# Patient Record
Sex: Female | Born: 1955
Health system: Southern US, Community
[De-identification: ages and names within clinical notes are randomized; demographics above are authoritative.]

---

## 2003-03-18 ENCOUNTER — Emergency Department (HOSPITAL_COMMUNITY): Admission: EM | Admit: 2003-03-18 | Discharge: 2003-03-18 | Payer: Self-pay | Admitting: Emergency Medicine

## 2003-08-15 ENCOUNTER — Ambulatory Visit (HOSPITAL_COMMUNITY): Admission: RE | Admit: 2003-08-15 | Discharge: 2003-08-15 | Payer: Self-pay | Admitting: Family Medicine

## 2007-04-07 ENCOUNTER — Other Ambulatory Visit: Admission: RE | Admit: 2007-04-07 | Discharge: 2007-04-07 | Payer: Self-pay | Admitting: Family Medicine

## 2015-12-09 ENCOUNTER — Encounter (HOSPITAL_COMMUNITY): Payer: Self-pay | Admitting: Nurse Practitioner

## 2015-12-09 ENCOUNTER — Observation Stay (HOSPITAL_COMMUNITY)
Admission: EM | Admit: 2015-12-09 | Discharge: 2015-12-11 | Disposition: A | Discharge status: Home / Self Care | Payer: BLUE CROSS/BLUE SHIELD | Attending: General Surgery | Admitting: General Surgery

## 2015-12-09 ENCOUNTER — Emergency Department (HOSPITAL_COMMUNITY): Payer: BLUE CROSS/BLUE SHIELD

## 2015-12-09 DIAGNOSIS — R109 Unspecified abdominal pain: Secondary | ICD-10-CM | POA: Diagnosis present

## 2015-12-09 DIAGNOSIS — K388 Other specified diseases of appendix: Secondary | ICD-10-CM | POA: Diagnosis not present

## 2015-12-09 DIAGNOSIS — K353 Acute appendicitis with localized peritonitis: Principal | ICD-10-CM | POA: Insufficient documentation

## 2015-12-09 DIAGNOSIS — K3580 Unspecified acute appendicitis: Secondary | ICD-10-CM | POA: Diagnosis present

## 2015-12-09 LAB — COMPREHENSIVE METABOLIC PANEL
ALT: 24 U/L (ref 14–54)
AST: 27 U/L (ref 15–41)
Albumin: 4.1 g/dL (ref 3.5–5.0)
Alkaline Phosphatase: 78 U/L (ref 38–126)
Anion gap: 10 (ref 5–15)
BUN: 11 mg/dL (ref 6–20)
CO2: 22 mmol/L (ref 22–32)
Calcium: 9.2 mg/dL (ref 8.9–10.3)
Chloride: 107 mmol/L (ref 101–111)
Creatinine, Ser: 0.79 mg/dL (ref 0.44–1.00)
GFR calc Af Amer: >60 mL/min (ref >60)
GFR calc non Af Amer: >60 mL/min (ref >60)
Glucose, Bld: 103 mg/dL — ABNORMAL HIGH (ref 65–99)
Potassium: 4.1 mmol/L (ref 3.5–5.1)
Sodium: 139 mmol/L (ref 135–145)
Total Bilirubin: 0.7 mg/dL (ref 0.3–1.2)
Total Protein: 7 g/dL (ref 6.5–8.1)

## 2015-12-09 LAB — I-STAT CG4 LACTIC ACID, ED: Lactic Acid, Venous: 0.96 mmol/L (ref 0.5–2.0)

## 2015-12-09 LAB — URINALYSIS, ROUTINE W REFLEX MICROSCOPIC
Bilirubin Urine: NEGATIVE
Glucose, UA: NEGATIVE mg/dL
Ketones, ur: NEGATIVE mg/dL
Leukocytes, UA: NEGATIVE
Nitrite: NEGATIVE
Protein, ur: NEGATIVE mg/dL
Specific Gravity, Urine: 1.012 (ref 1.005–1.030)
pH: 6 (ref 5.0–8.0)

## 2015-12-09 LAB — I-STAT CHEM 8, ED
BUN: 16 mg/dL (ref 6–20)
Calcium, Ion: 1.06 mmol/L — ABNORMAL LOW (ref 1.13–1.30)
Chloride: 105 mmol/L (ref 101–111)
Creatinine, Ser: 0.7 mg/dL (ref 0.44–1.00)
Glucose, Bld: 99 mg/dL (ref 65–99)
HCT: 44 % (ref 36.0–46.0)
Hemoglobin: 15 g/dL (ref 12.0–15.0)
Potassium: 3.9 mmol/L (ref 3.5–5.1)
Sodium: 139 mmol/L (ref 135–145)
TCO2: 23 mmol/L (ref 0–100)

## 2015-12-09 LAB — URINE MICROSCOPIC-ADD ON: Bacteria, UA: NONE SEEN

## 2015-12-09 LAB — LIPASE, BLOOD: Lipase: 30 U/L (ref 11–51)

## 2015-12-09 LAB — CBC
HCT: 41.9 % (ref 36.0–46.0)
Hemoglobin: 14.4 g/dL (ref 12.0–15.0)
MCH: 30.1 pg (ref 26.0–34.0)
MCHC: 34.4 g/dL (ref 30.0–36.0)
MCV: 87.7 fL (ref 78.0–100.0)
Platelets: 305 10*3/uL (ref 150–400)
RBC: 4.78 MIL/uL (ref 3.87–5.11)
RDW: 13.8 % (ref 11.5–15.5)
WBC: 9.3 10*3/uL (ref 4.0–10.5)

## 2015-12-09 MED ORDER — IOPAMIDOL (ISOVUE-300) INJECTION 61%
INTRAVENOUS | Status: AC
  Administered 2015-12-09: 100 mL
  Filled 2015-12-09: qty 100

## 2015-12-09 MED ORDER — IOHEXOL 300 MG/ML  SOLN
25.0000 mL | Freq: Once | INTRAMUSCULAR | Status: AC | PRN
  Administered 2015-12-09: 25 mL via ORAL

## 2015-12-09 MED ORDER — ONDANSETRON HCL 4 MG/2ML IJ SOLN
4.0000 mg | Freq: Once | INTRAMUSCULAR | Status: AC
  Administered 2015-12-10: 4 mg via INTRAVENOUS
  Filled 2015-12-09 (×2): qty 2

## 2015-12-09 MED ORDER — METRONIDAZOLE IN NACL 5-0.79 MG/ML-% IV SOLN
500.0000 mg | Freq: Once | INTRAVENOUS | Status: AC
  Administered 2015-12-09: 500 mg via INTRAVENOUS
  Filled 2015-12-09: qty 100

## 2015-12-09 MED ORDER — MORPHINE SULFATE (PF) 4 MG/ML IV SOLN
4.0000 mg | Freq: Once | INTRAVENOUS | Status: AC
  Administered 2015-12-10: 4 mg via INTRAVENOUS
  Filled 2015-12-09 (×2): qty 1

## 2015-12-09 MED ORDER — DEXTROSE 5 % IV SOLN
2.0000 g | Freq: Once | INTRAVENOUS | Status: AC
  Administered 2015-12-09: 2 g via INTRAVENOUS
  Filled 2015-12-09: qty 2

## 2015-12-09 MED ORDER — SODIUM CHLORIDE 0.9 % IV BOLUS (SEPSIS)
1000.0000 mL | Freq: Once | INTRAVENOUS | Status: AC
  Administered 2015-12-09: 1000 mL via INTRAVENOUS

## 2015-12-09 NOTE — ED Notes (Addendum)
She c/o sudden onset RLQ pain this am. Pain increased with palpation. Pain increasingly worse since onset. Pt reports anorexia. Pt denies n,v/bowel, bladder changes. She had a temperature of 99.2 this am

## 2015-12-09 NOTE — ED Provider Notes (Signed)
CSN: [id number]     Arrival date & time 12/09/15  1842 History   First MD Initiated Contact with Patient 12/09/15 1852     Chief Complaint  Patient presents with  . Abdominal Pain     (Consider location/radiation/quality/duration/timing/severity/associated sxs/prior Treatment) Patient is a 60 y.o. female presenting with abdominal pain. The history is provided by the patient.  Abdominal Pain Pain location:  RLQ Pain quality: cramping, sharp and shooting   Pain radiates to:  Does not radiate Pain severity:  Severe Onset quality:  Gradual Duration:  1 day Timing:  Constant Progression:  Worsening Chronicity:  New Relieved by:  Nothing Worsened by:  Movement and palpation Ineffective treatments:  None tried Associated symptoms: nausea   Associated symptoms: no chest pain, no chills, no dysuria, no fever, no shortness of breath and no vomiting    60 yo F With a chief complaint of right lower quadrant abdominal pain. This been going on for the past day. Worsening throughout the day and has had some anorexia with this as well. Low-grade fever this morning of 99. Denies any vomiting or diarrhea. Last bowel movement was today and was normal. Pain is crampy comes and goes units into the right flank. Denies history of kidney stones. Denies injury. Pain is also worse with movement palpation.  History reviewed. No pertinent past medical history. Past Surgical History  Procedure Laterality Date  . Cesarean section     History reviewed. No pertinent family history. Social History  Substance Use Topics  . Smoking status: Never Smoker   . Smokeless tobacco: None  . Alcohol Use: Yes   OB History    No data available     Review of Systems  Constitutional: Negative for fever and chills.  HENT: Negative for congestion and rhinorrhea.   Eyes: Negative for redness and visual disturbance.  Respiratory: Negative for shortness of breath and wheezing.   Cardiovascular: Negative for chest pain  and palpitations.  Gastrointestinal: Positive for nausea and abdominal pain. Negative for vomiting.  Genitourinary: Negative for dysuria and urgency.  Musculoskeletal: Negative for myalgias and arthralgias.  Skin: Negative for pallor and wound.  Neurological: Negative for dizziness and headaches.      Allergies  Z-pak  Home Medications   Prior to Admission medications   Not on File   BP 148/107 mmHg  Pulse 107  Temp(Src) 98.3 F (36.8 C) (Oral)  Resp 18  SpO2 98% Physical Exam  Constitutional: She is oriented to person, place, and time. She appears well-developed and well-nourished. No distress.  HENT:  Head: Normocephalic and atraumatic.  Eyes: EOM are normal. Pupils are equal, round, and reactive to light.  Neck: Normal range of motion. Neck supple.  Cardiovascular: Normal rate and regular rhythm.  Exam reveals no gallop and no friction rub.   No murmur heard. Pulmonary/Chest: Effort normal. She has no wheezes. She has no rales.  Abdominal: Soft. She exhibits no distension. There is tenderness. There is no rebound and no guarding.  Tender palpation at McBurney's point. No rebound no guarding positive Rosvigs. Negative psoas negative obturator.  Musculoskeletal: She exhibits no edema or tenderness.  Neurological: She is alert and oriented to person, place, and time.  Skin: Skin is warm and dry. She is not diaphoretic.  Psychiatric: She has a normal mood and affect. Her behavior is normal.  Nursing note and vitals reviewed.   ED Course  Procedures (including critical care time) Labs Review Labs Reviewed  I-STAT CHEM 8, ED -  Abnormal; Notable for the following:    Calcium, Ion 1.06 (*)    All other components within normal limits  CBC  COMPREHENSIVE METABOLIC PANEL  URINALYSIS, ROUTINE W REFLEX MICROSCOPIC (NOT [facility])  LIPASE, BLOOD  I-STAT CG4 LACTIC ACID, ED    Imaging Review No results found. I have personally reviewed and evaluated these images and lab  results as part of my medical decision-making.   EKG Interpretation None      MDM   Final diagnoses:  Acute appendicitis, unspecified acute appendicitis type    60 yo F with a chief complaint of right lower quadrant tenderness. Patient has pain at McBurney's point. We'll obtain a CT scan to evaluate for appendicitis.  CT with appy, discussed with Dr. Donell BeersByerly. Abx ordered per order set.   The patients results and plan were reviewed and discussed.   Any x-rays performed were independently reviewed by myself.   Differential diagnosis were considered with the presenting HPI.  Medications  morphine 4 MG/ML injection 4 mg (4 mg Intravenous Not Given 12/09/15 1927)  ondansetron (ZOFRAN) injection 4 mg (4 mg Intravenous Not Given 12/09/15 1927)  cefTRIAXone (ROCEPHIN) 2 g in dextrose 5 % 50 mL IVPB (2 g Intravenous New Bag/Given 12/09/15 2246)    And  metroNIDAZOLE (FLAGYL) IVPB 500 mg (not administered)  sodium chloride 0.9 % bolus 1,000 mL (0 mLs Intravenous Stopped 12/09/15 2236)  iopamidol (ISOVUE-300) 61 % injection (100 mLs  Contrast Given 12/09/15 2201)  iohexol (OMNIPAQUE) 300 MG/ML solution 25 mL (25 mLs Oral Contrast Given 12/09/15 2147)    Filed Vitals:   12/09/15 1858 12/09/15 2000 12/09/15 2100 12/09/15 2236  BP: 148/107 165/83 158/84 150/97  Pulse: 107 94 96 98  Temp:      TempSrc:      Resp: 18 16 16 16   SpO2: 98% 95% 98% 96%    Final diagnoses:  Acute appendicitis, unspecified acute appendicitis type    Admission/ observation were discussed with the admitting physician, patient and/or family and they are comfortable with the plan.    Melene Planan Merril Isakson, DO 12/09/15 2257

## 2015-12-10 ENCOUNTER — Observation Stay (HOSPITAL_COMMUNITY): Payer: BLUE CROSS/BLUE SHIELD | Admitting: Anesthesiology

## 2015-12-10 ENCOUNTER — Encounter (HOSPITAL_COMMUNITY)
Admission: EM | Disposition: A | Discharge status: Home / Self Care | Payer: Self-pay | Source: Home / Self Care | Attending: Emergency Medicine

## 2015-12-10 ENCOUNTER — Encounter (HOSPITAL_COMMUNITY): Payer: Self-pay | Admitting: Anesthesiology

## 2015-12-10 DIAGNOSIS — K3580 Unspecified acute appendicitis: Secondary | ICD-10-CM | POA: Diagnosis present

## 2015-12-10 HISTORY — PX: LAPAROSCOPIC APPENDECTOMY: SHX408

## 2015-12-10 LAB — SURGICAL PCR SCREEN
MRSA, PCR: NEGATIVE
STAPHYLOCOCCUS AUREUS: NEGATIVE

## 2015-12-10 SURGERY — APPENDECTOMY, LAPAROSCOPIC
Anesthesia: General

## 2015-12-10 MED ORDER — DEXAMETHASONE SODIUM PHOSPHATE 4 MG/ML IJ SOLN
INTRAMUSCULAR | Status: DC | PRN
  Administered 2015-12-10: 4 mg via INTRAVENOUS

## 2015-12-10 MED ORDER — KCL IN DEXTROSE-NACL 20-5-0.45 MEQ/L-%-% IV SOLN
INTRAVENOUS | Status: DC
  Administered 2015-12-10 – 2015-12-11 (×3): via INTRAVENOUS
  Filled 2015-12-10 (×3): qty 1000

## 2015-12-10 MED ORDER — ARTIFICIAL TEARS OP OINT
TOPICAL_OINTMENT | OPHTHALMIC | Status: AC
  Filled 2015-12-10: qty 3.5

## 2015-12-10 MED ORDER — DIPHENHYDRAMINE HCL 12.5 MG/5ML PO ELIX: 12.5000 mg | ORAL_SOLUTION | Freq: Four times a day (QID) | ORAL | Status: DC | PRN

## 2015-12-10 MED ORDER — SIMETHICONE 80 MG PO CHEW: 40.0000 mg | CHEWABLE_TABLET | Freq: Four times a day (QID) | ORAL | Status: DC | PRN

## 2015-12-10 MED ORDER — LACTATED RINGERS IV SOLN
INTRAVENOUS | Status: DC | PRN
  Administered 2015-12-10: 07:00:00 via INTRAVENOUS

## 2015-12-10 MED ORDER — METHOCARBAMOL 500 MG PO TABS: 500.0000 mg | ORAL_TABLET | Freq: Four times a day (QID) | ORAL | Status: DC | PRN

## 2015-12-10 MED ORDER — LIDOCAINE HCL (CARDIAC) 20 MG/ML IV SOLN
INTRAVENOUS | Status: DC | PRN
  Administered 2015-12-10: 80 mg via INTRAVENOUS

## 2015-12-10 MED ORDER — ACETAMINOPHEN 650 MG RE SUPP
650.0000 mg | Freq: Four times a day (QID) | RECTAL | Status: DC | PRN
  Administered 2015-12-10: 650 mg via RECTAL
  Filled 2015-12-10: qty 1

## 2015-12-10 MED ORDER — BUPIVACAINE-EPINEPHRINE (PF) 0.25% -1:200000 IJ SOLN
INTRAMUSCULAR | Status: AC
  Filled 2015-12-10: qty 30

## 2015-12-10 MED ORDER — ONDANSETRON HCL 4 MG/2ML IJ SOLN
INTRAMUSCULAR | Status: DC | PRN
  Administered 2015-12-10: 4 mg via INTRAVENOUS

## 2015-12-10 MED ORDER — MIDAZOLAM HCL 5 MG/5ML IJ SOLN
INTRAMUSCULAR | Status: DC | PRN
  Administered 2015-12-10: 2 mg via INTRAVENOUS

## 2015-12-10 MED ORDER — DOCUSATE SODIUM 100 MG PO CAPS
100.0000 mg | ORAL_CAPSULE | Freq: Two times a day (BID) | ORAL | Status: DC
  Administered 2015-12-10 – 2015-12-11 (×2): 100 mg via ORAL
  Filled 2015-12-10 (×2): qty 1

## 2015-12-10 MED ORDER — DIPHENHYDRAMINE HCL 50 MG/ML IJ SOLN: 12.5000 mg | Freq: Four times a day (QID) | INTRAMUSCULAR | Status: DC | PRN

## 2015-12-10 MED ORDER — DEXTROSE 5 % IV SOLN
2.0000 g | INTRAVENOUS | Status: DC
  Administered 2015-12-10: 2 g via INTRAVENOUS
  Filled 2015-12-10 (×2): qty 2

## 2015-12-10 MED ORDER — SUCCINYLCHOLINE CHLORIDE 20 MG/ML IJ SOLN
INTRAMUSCULAR | Status: AC
  Filled 2015-12-10: qty 1

## 2015-12-10 MED ORDER — ONDANSETRON 4 MG PO TBDP: 4.0000 mg | ORAL_TABLET | Freq: Four times a day (QID) | ORAL | Status: DC | PRN

## 2015-12-10 MED ORDER — HYDROMORPHONE HCL 1 MG/ML IJ SOLN: 0.2500 mg | INTRAMUSCULAR | Status: DC | PRN

## 2015-12-10 MED ORDER — ROCURONIUM BROMIDE 50 MG/5ML IV SOLN
INTRAVENOUS | Status: AC
  Filled 2015-12-10: qty 1

## 2015-12-10 MED ORDER — SODIUM CHLORIDE 0.9 % IJ SOLN
INTRAMUSCULAR | Status: AC
  Filled 2015-12-10: qty 10

## 2015-12-10 MED ORDER — ZOLPIDEM TARTRATE 5 MG PO TABS: 5.0000 mg | ORAL_TABLET | Freq: Every evening | ORAL | Status: DC | PRN

## 2015-12-10 MED ORDER — MIDAZOLAM HCL 2 MG/2ML IJ SOLN
INTRAMUSCULAR | Status: AC
  Filled 2015-12-10: qty 2

## 2015-12-10 MED ORDER — BUPIVACAINE-EPINEPHRINE 0.5% -1:200000 IJ SOLN
INTRAMUSCULAR | Status: DC | PRN
  Administered 2015-12-10: 7 mL

## 2015-12-10 MED ORDER — PHENYLEPHRINE HCL 10 MG/ML IJ SOLN
INTRAMUSCULAR | Status: AC
  Filled 2015-12-10: qty 1

## 2015-12-10 MED ORDER — PHENYLEPHRINE HCL 10 MG/ML IJ SOLN
INTRAMUSCULAR | Status: DC | PRN
  Administered 2015-12-10 (×4): 40 ug via INTRAVENOUS

## 2015-12-10 MED ORDER — EPHEDRINE SULFATE 50 MG/ML IJ SOLN
INTRAMUSCULAR | Status: DC | PRN
Start: 2015-12-10 — End: 2015-12-10
  Administered 2015-12-10 (×2): 10 mg via INTRAVENOUS

## 2015-12-10 MED ORDER — METRONIDAZOLE IN NACL 5-0.79 MG/ML-% IV SOLN
500.0000 mg | Freq: Three times a day (TID) | INTRAVENOUS | Status: DC
  Administered 2015-12-10 – 2015-12-11 (×4): 500 mg via INTRAVENOUS
  Filled 2015-12-10 (×6): qty 100

## 2015-12-10 MED ORDER — OXYCODONE HCL 5 MG PO TABS
5.0000 mg | ORAL_TABLET | ORAL | Status: DC | PRN
  Administered 2015-12-10: 5 mg via ORAL
  Filled 2015-12-10: qty 1

## 2015-12-10 MED ORDER — ONDANSETRON HCL 4 MG/2ML IJ SOLN: 4.0000 mg | Freq: Four times a day (QID) | INTRAMUSCULAR | Status: DC | PRN

## 2015-12-10 MED ORDER — KETOROLAC TROMETHAMINE 30 MG/ML IJ SOLN: 30.0000 mg | Freq: Four times a day (QID) | INTRAMUSCULAR | Status: DC | PRN

## 2015-12-10 MED ORDER — PROPOFOL 10 MG/ML IV BOLUS
INTRAVENOUS | Status: DC | PRN
  Administered 2015-12-10: 170 mg via INTRAVENOUS
  Administered 2015-12-10: 30 mg via INTRAVENOUS

## 2015-12-10 MED ORDER — FENTANYL CITRATE (PF) 100 MCG/2ML IJ SOLN
INTRAMUSCULAR | Status: DC | PRN
  Administered 2015-12-10 (×3): 50 ug via INTRAVENOUS
  Administered 2015-12-10: 100 ug via INTRAVENOUS

## 2015-12-10 MED ORDER — KETOROLAC TROMETHAMINE 30 MG/ML IJ SOLN
30.0000 mg | Freq: Four times a day (QID) | INTRAMUSCULAR | Status: AC
  Administered 2015-12-10 – 2015-12-11 (×5): 30 mg via INTRAVENOUS
  Filled 2015-12-10 (×5): qty 1

## 2015-12-10 MED ORDER — 0.9 % SODIUM CHLORIDE (POUR BTL) OPTIME
TOPICAL | Status: DC | PRN
  Administered 2015-12-10: 1000 mL

## 2015-12-10 MED ORDER — FENTANYL CITRATE (PF) 250 MCG/5ML IJ SOLN
INTRAMUSCULAR | Status: AC
  Filled 2015-12-10: qty 5

## 2015-12-10 MED ORDER — EPHEDRINE SULFATE 50 MG/ML IJ SOLN
INTRAMUSCULAR | Status: AC
  Filled 2015-12-10: qty 1

## 2015-12-10 MED ORDER — SUGAMMADEX SODIUM 200 MG/2ML IV SOLN
INTRAVENOUS | Status: DC | PRN
  Administered 2015-12-10: 148 mg via INTRAVENOUS

## 2015-12-10 MED ORDER — PROPOFOL 10 MG/ML IV BOLUS
INTRAVENOUS | Status: AC
  Filled 2015-12-10: qty 20

## 2015-12-10 MED ORDER — LORATADINE 10 MG PO TABS: 10.0000 mg | ORAL_TABLET | Freq: Every day | ORAL | Status: DC | PRN

## 2015-12-10 MED ORDER — SODIUM CHLORIDE 0.9 % IR SOLN
Status: DC | PRN
  Administered 2015-12-10: 1

## 2015-12-10 MED ORDER — ACETAMINOPHEN 325 MG PO TABS: 650.0000 mg | ORAL_TABLET | Freq: Four times a day (QID) | ORAL | Status: DC | PRN

## 2015-12-10 MED ORDER — LIDOCAINE HCL (CARDIAC) 20 MG/ML IV SOLN
INTRAVENOUS | Status: AC
  Filled 2015-12-10: qty 5

## 2015-12-10 MED ORDER — ROCURONIUM BROMIDE 100 MG/10ML IV SOLN
INTRAVENOUS | Status: DC | PRN
  Administered 2015-12-10: 50 mg via INTRAVENOUS

## 2015-12-10 MED ORDER — MORPHINE SULFATE (PF) 2 MG/ML IV SOLN: 1.0000 mg | INTRAVENOUS | Status: DC | PRN

## 2015-12-10 MED ORDER — SUGAMMADEX SODIUM 200 MG/2ML IV SOLN
INTRAVENOUS | Status: AC
  Filled 2015-12-10: qty 2

## 2015-12-10 MED ORDER — PHENYLEPHRINE HCL 10 MG/ML IJ SOLN
10.0000 mg | INTRAVENOUS | Status: DC | PRN
  Administered 2015-12-10: 25 ug/min via INTRAVENOUS

## 2015-12-10 MED ORDER — FLUTICASONE PROPIONATE 50 MCG/ACT NA SUSP
1.0000 | Freq: Every day | NASAL | Status: DC | PRN
  Filled 2015-12-10: qty 16

## 2015-12-10 MED ORDER — ONDANSETRON HCL 4 MG/2ML IJ SOLN
INTRAMUSCULAR | Status: AC
  Filled 2015-12-10: qty 2

## 2015-12-10 MED ORDER — DEXAMETHASONE SODIUM PHOSPHATE 4 MG/ML IJ SOLN
INTRAMUSCULAR | Status: AC
  Filled 2015-12-10: qty 1

## 2015-12-10 SURGICAL SUPPLY — 40 items
APPLIER CLIP ROT 10 11.4 M/L (STAPLE)
BLADE SURG ROTATE 9660 (MISCELLANEOUS) IMPLANT
CANISTER SUCTION 2500CC (MISCELLANEOUS) ×2 IMPLANT
CHLORAPREP W/TINT 26ML (MISCELLANEOUS) ×2 IMPLANT
CLIP APPLIE ROT 10 11.4 M/L (STAPLE) IMPLANT
COVER SURGICAL LIGHT HANDLE (MISCELLANEOUS) ×2 IMPLANT
CUTTER FLEX LINEAR 45M (STAPLE) ×2 IMPLANT
DERMABOND ADHESIVE PROPEN (GAUZE/BANDAGES/DRESSINGS) ×1
DERMABOND ADVANCED .7 DNX6 (GAUZE/BANDAGES/DRESSINGS) ×1 IMPLANT
DRAPE WARM FLUID 44X44 (DRAPE) ×2 IMPLANT
ELECT REM PT RETURN 9FT ADLT (ELECTROSURGICAL)
ELECTRODE REM PT RTRN 9FT ADLT (ELECTROSURGICAL) IMPLANT
ENDOLOOP SUT PDS II  0 18 (SUTURE)
ENDOLOOP SUT PDS II 0 18 (SUTURE) IMPLANT
GLOVE BIO SURGEON STRL SZ8 (GLOVE) ×2 IMPLANT
GLOVE BIOGEL PI IND STRL 8 (GLOVE) ×1 IMPLANT
GLOVE BIOGEL PI INDICATOR 8 (GLOVE) ×1
GOWN STRL REUS W/ TWL LRG LVL3 (GOWN DISPOSABLE) ×1 IMPLANT
GOWN STRL REUS W/ TWL XL LVL3 (GOWN DISPOSABLE) ×1 IMPLANT
GOWN STRL REUS W/TWL LRG LVL3 (GOWN DISPOSABLE) ×1
GOWN STRL REUS W/TWL XL LVL3 (GOWN DISPOSABLE) ×1
KIT BASIN OR (CUSTOM PROCEDURE TRAY) ×2 IMPLANT
KIT ROOM TURNOVER OR (KITS) ×2 IMPLANT
LIQUID BAND (GAUZE/BANDAGES/DRESSINGS) ×2 IMPLANT
NS IRRIG 1000ML POUR BTL (IV SOLUTION) ×4 IMPLANT
PAD ARMBOARD 7.5X6 YLW CONV (MISCELLANEOUS) ×4 IMPLANT
POUCH SPECIMEN RETRIEVAL 10MM (ENDOMECHANICALS) ×2 IMPLANT
RELOAD STAPLE TA45 3.5 REG BLU (ENDOMECHANICALS) ×2 IMPLANT
SCALPEL HARMONIC ACE (MISCELLANEOUS) ×2 IMPLANT
SET IRRIG TUBING LAPAROSCOPIC (IRRIGATION / IRRIGATOR) ×2 IMPLANT
SPECIMEN JAR SMALL (MISCELLANEOUS) ×2 IMPLANT
SUT MON AB 4-0 PC3 18 (SUTURE) ×2 IMPLANT
TOWEL OR 17X24 6PK STRL BLUE (TOWEL DISPOSABLE) ×2 IMPLANT
TOWEL OR 17X26 10 PK STRL BLUE (TOWEL DISPOSABLE) ×2 IMPLANT
TRAY FOLEY BAG SILVER LF 14FR (SET/KITS/TRAYS/PACK) ×2 IMPLANT
TRAY FOLEY CATH 16FR SILVER (SET/KITS/TRAYS/PACK) IMPLANT
TRAY LAPAROSCOPIC MC (CUSTOM PROCEDURE TRAY) ×2 IMPLANT
TROCAR XCEL BLADELESS 5X75MML (TROCAR) ×4 IMPLANT
TROCAR XCEL BLUNT TIP 100MML (ENDOMECHANICALS) ×2 IMPLANT
TUBING INSUFFLATION (TUBING) ×2 IMPLANT

## 2015-12-10 NOTE — Brief Op Note (Signed)
12/09/2015 - 12/10/2015  8:43 AM  PATIENT:  Tiffany Singh  60 y.o. female  PRE-OPERATIVE DIAGNOSIS:  acute appendicitis  POST-OPERATIVE DIAGNOSIS:  acute appendicitis with perforation at base of appendix without abscess   PROCEDURE:  Procedure(s): APPENDECTOMY LAPAROSCOPIC (N/A)  SURGEON:  Surgeon(s) and Role:    * Harriette Bouillonhomas Forrester Blando, MD - Primary   ASSISTANTS: none   ANESTHESIA:   local and general  EBL:  Total I/O In: 750 [I.V.:750] Out: 10 [Blood:10]  BLOOD ADMINISTERED:none  DRAINS: none   LOCAL MEDICATIONS USED:  BUPIVICAINE   SPECIMEN:  Source of Specimen:  appendix  DISPOSITION OF SPECIMEN:  PATHOLOGY  COUNTS:  YES  TOURNIQUET:  * No tourniquets in log *  DICTATION: .Other Dictation: Dictation Number [id number]  PLAN OF CARE: Admit for overnight observation  PATIENT DISPOSITION:  PACU - hemodynamically stable.   Delay start of Pharmacological VTE agent (>24hrs) due to surgical blood loss or risk of bleeding: no

## 2015-12-10 NOTE — ED Notes (Signed)
Attempted report x1. 

## 2015-12-10 NOTE — Progress Notes (Signed)
Pt having chills, temperature checked it's 103.1. Dr. Donell BeersByerly notified- tylenol Supp. 650mg  given. Will rechecked temp. after an hour.

## 2015-12-10 NOTE — Anesthesia Preprocedure Evaluation (Addendum)
Anesthesia Evaluation  Patient identified by MRN, date of birth, ID band Patient awake    Reviewed: Allergy & Precautions, NPO status , Patient's Chart, lab work & pertinent test results  Airway Mallampati: II  TM Distance: >3 FB Neck ROM: Full    Dental  (+) Teeth Intact, Dental Advisory Given   Pulmonary neg pulmonary ROS,    breath sounds clear to auscultation       Cardiovascular negative cardio ROS   Rhythm:Regular Rate:Normal     Neuro/Psych    GI/Hepatic Neg liver ROS, History noted. CE   Endo/Other    Renal/GU negative Renal ROS     Musculoskeletal   Abdominal   Peds  Hematology   Anesthesia Other Findings Patient's sister has pseudocholinesterase deficiency.  Acute appendicitis.  Contrast PO around 10pm.  No solid food since lunch yesterday.  Denies vomiting.  Reproductive/Obstetrics                            Anesthesia Physical Anesthesia Plan  ASA: III  Anesthesia Plan: General   Post-op Pain Management:    Induction: Intravenous  Airway Management Planned: Oral ETT  Additional Equipment:   Intra-op Plan:   Post-operative Plan: Extubation in OR  Informed Consent: I have reviewed the patients History and Physical, chart, labs and discussed the procedure including the risks, benefits and alternatives for the proposed anesthesia with the patient or authorized representative who has indicated his/her understanding and acceptance.   Dental advisory given  Plan Discussed with: CRNA and Anesthesiologist  Anesthesia Plan Comments:         Anesthesia Quick Evaluation

## 2015-12-10 NOTE — Op Note (Signed)
NAME:  Alcide GoodnessMCINDOE, Jeimy               ACCOUNT NO.:  [id number]  MEDICAL RECORD NO.:  [id number]  LOCATION:  [facility]                         FACILITY:  [facility]  PHYSICIAN:  Maisie Fushomas A. Shaira Sova, M.D.DATE OF BIRTH:  1956-08-16  DATE OF PROCEDURE:  12/10/2015 DATE OF DISCHARGE:                              OPERATIVE REPORT   PREOPERATIVE DIAGNOSIS:  Acute appendicitis.  POSTOPERATIVE DIAGNOSIS:  Acute appendicitis with microperforation at the base of the appendix without abscess.  PROCEDURE:  Laparoscopic appendectomy.  SURGEON:  Harriette Bouillonhomas Sullivan Jacuinde MD.  ANESTHESIA:  General endotracheal anesthesia with 0.25% Sensorcaine local with epinephrine.  EBL:  Minimal.  SPECIMEN:  Appendix to pathology.  DRAINS:  None.  INDICATIONS FOR PROCEDURE:  The patient is a 60 year old female, seen her earlier this morning by Dr. Gershon MusselBarley for acute appendicitis.  She was brought to the operating room today for laparoscopic appendectomy after discussion with the patient and her husband of treatment options.  The pathophysiology of appendicitis discussed.  Laparoscopic and open procedures discussed.  Medical management discussed.  Risks of surgery were discussed to include, but not exclusive of, bleeding, infection, intraabdominal abscess, small bowel injury, large bowel injury, open procedure, blood clots, stroke, the need for other therapies and or treatments depending upon postoperative course, intraabdominal abscess, stump leak, and the need for other additional procedures.  She understood all the above potential risks and agreed to proceed.  DESCRIPTION OF PROCEDURE:  The patient was seen in the holding area and questions were answered.  She was taken back to the operating room and placed supine on the OR table.  After induction of general anesthesia, Foley catheter was placed.  Both arms were tucked and her abdomen was prepped and draped in sterile fashion.  Time-out was done.  She received preoperative  antibiotics.  1 cm supraumbilical incision was made. Dissection was carried down to the midline with the fascia was opened. In the midline the abdominal cavity was entered under direct vision. Purse string suture and 0 Vicryl was placed.  A 12 mm Hasson cannula was placed under direct vision.  Pneumoperitoneum was created to 15 mmHg of CO2 pressure.  Laparoscope was then placed.  Upon inspection, the gallbladder and liver were grossly normal.  The stomach was normal.  The small bowel and colon were grossly normal.  The appendix was identified and was inflamed.  There appeared to be a small area of perforation right at the base of the appendix without abscess or any significant contamination.  A 5 mm port was placed in the lower midline and a 2nd 5 mm port was placed in the upper midline.  We used the Harmonic scalpel to take the mesoappendix down to fully dissect out the base of the appendix.  A window was created and was able then to slide a GIA 45 stapling device below the level of perforation on to the junction of the appendix and cecum to exclude the perforation.  This was fired.  The mesoappendix was then taken down with the Harmonic scalpel.  Upon inspection of the base I saw no evidence of leakage or bleeding.  The appendix was placed in an EndoCatch bag and extracted.  I then  irrigated out the abdominal cavity with saline.  Upon reinspection again to the stump of the appendix, I could not see any evidence of bleeding or leakage.  Appeared to be intact with staples in place.  I put a saline over this and then compressed it to evaluate for an air leak and saw no evidence of that.  I also saw no leakage of stool.  Remainder of the irrigation was suctioned out.  Reinspection into the abdominal cavity revealed no evidence of any significant other abnormality nor injury from insertion of her ports.  The ports were then extracted allowing the CO2 to escape.  The umbilical port site was  closed with 0 Vicryl.  A 4-0 Monocryl was used to close the skin in a subcuticular fashion.  Thick adhesive applied.  Foley catheter removed.  All final counts were found to be correct.  The patient was then awoken, taken to recovery, extubated in satisfactory condition.     Maleek Craver A. Yisell Sprunger, M.D.     TAC/MEDQ  D:  12/10/2015  T:  12/10/2015  Job:  [id number]

## 2015-12-10 NOTE — H&P (Signed)
Tiffany Singh is an 60 y.o. female.   Chief Complaint: Abdominal pain. HPI:  Pt is a 60 yo female who presents with 24 hours of abdominal pain.  This started in the RLQ.  It was worsening through the day.  She was able to eat a small lunch, but no dinner.  She describes the pain and crampy and sharp.  She had a low grade fever, but no rigors.  She denies n/v/d/constipation.  She has never had pain like this before.    History reviewed. No pertinent past medical history.  Past Surgical History  Procedure Laterality Date  . Cesarean section      History reviewed. No pertinent family history. Social History:  reports that she has never smoked. She does not have any smokeless tobacco history on file. She reports that she drinks alcohol. She reports that she does not use illicit drugs.  Allergies:  Allergies  Allergen Reactions  . Scallops [Shellfish Allergy] Nausea And Vomiting  . Z-Pak [Azithromycin] Hives and Itching    ER visit   Meds: Flonase Claritin Mag gluconate  Results for orders placed or performed during the hospital encounter of 12/09/15 (from the past 48 hour(s))  Urinalysis, Routine w reflex microscopic (not at [facility])     Status: Abnormal   Collection Time: 12/09/15  7:12 PM  Result Value Ref Range   Color, Urine YELLOW YELLOW   APPearance CLEAR CLEAR   Specific Gravity, Urine 1.012 1.005 - 1.030   pH 6.0 5.0 - 8.0   Glucose, UA NEGATIVE NEGATIVE mg/dL   Hgb urine dipstick TRACE (A) NEGATIVE   Bilirubin Urine NEGATIVE NEGATIVE   Ketones, ur NEGATIVE NEGATIVE mg/dL   Protein, ur NEGATIVE NEGATIVE mg/dL   Nitrite NEGATIVE NEGATIVE   Leukocytes, UA NEGATIVE NEGATIVE  Urine microscopic-add on     Status: Abnormal   Collection Time: 12/09/15  7:12 PM  Result Value Ref Range   Squamous Epithelial / LPF 0-5 (A) NONE SEEN   WBC, UA 0-5 0 - 5 WBC/hpf   RBC / HPF 0-5 0 - 5 RBC/hpf   Bacteria, UA NONE SEEN NONE SEEN  Comprehensive metabolic panel     Status: Abnormal    Collection Time: 12/09/15  7:15 PM  Result Value Ref Range   Sodium 139 135 - 145 mmol/L   Potassium 4.1 3.5 - 5.1 mmol/L   Chloride 107 101 - 111 mmol/L   CO2 22 22 - 32 mmol/L   Glucose, Bld 103 (H) 65 - 99 mg/dL   BUN 11 6 - 20 mg/dL   Creatinine, Ser 0.79 0.44 - 1.00 mg/dL   Calcium 9.2 8.9 - 10.3 mg/dL   Total Protein 7.0 6.5 - 8.1 g/dL   Albumin 4.1 3.5 - 5.0 g/dL   AST 27 15 - 41 U/L   ALT 24 14 - 54 U/L   Alkaline Phosphatase 78 38 - 126 U/L   Total Bilirubin 0.7 0.3 - 1.2 mg/dL   GFR calc non Af Amer >60 >60 mL/min   GFR calc Af Amer >60 >60 mL/min    Comment: (NOTE) The eGFR has been calculated using the CKD EPI equation. This calculation has not been validated in all clinical situations. eGFR's persistently <60 mL/min signify possible Chronic Kidney Disease.    Anion gap 10 5 - 15  CBC     Status: None   Collection Time: 12/09/15  7:15 PM  Result Value Ref Range   WBC 9.3 4.0 - 10.5 K/uL  RBC 4.78 3.87 - 5.11 MIL/uL   Hemoglobin 14.4 12.0 - 15.0 g/dL   HCT 41.9 36.0 - 46.0 %   MCV 87.7 78.0 - 100.0 fL   MCH 30.1 26.0 - 34.0 pg   MCHC 34.4 30.0 - 36.0 g/dL   RDW 13.8 11.5 - 15.5 %   Platelets 305 150 - 400 K/uL  Lipase, blood     Status: None   Collection Time: 12/09/15  7:15 PM  Result Value Ref Range   Lipase 30 11 - 51 U/L  I-Stat Chem 8, ED     Status: Abnormal   Collection Time: 12/09/15  7:27 PM  Result Value Ref Range   Sodium 139 135 - 145 mmol/L   Potassium 3.9 3.5 - 5.1 mmol/L   Chloride 105 101 - 111 mmol/L   BUN 16 6 - 20 mg/dL   Creatinine, Ser 0.70 0.44 - 1.00 mg/dL   Glucose, Bld 99 65 - 99 mg/dL   Calcium, Ion 1.06 (L) 1.13 - 1.30 mmol/L   TCO2 23 0 - 100 mmol/L   Hemoglobin 15.0 12.0 - 15.0 g/dL   HCT 44.0 36.0 - 46.0 %  I-Stat CG4 Lactic Acid, ED     Status: None   Collection Time: 12/09/15  7:28 PM  Result Value Ref Range   Lactic Acid, Venous 0.96 0.5 - 2.0 mmol/L   Ct Abdomen Pelvis W Contrast  12/09/2015  CLINICAL DATA:   Right lower quadrant pain. EXAM: CT ABDOMEN AND PELVIS WITH CONTRAST TECHNIQUE: Multidetector CT imaging of the abdomen and pelvis was performed using the standard protocol following bolus administration of intravenous contrast. CONTRAST:  172m ISOVUE-300 IOPAMIDOL (ISOVUE-300) INJECTION 61%, 239mOMNIPAQUE IOHEXOL 300 MG/ML SOLN COMPARISON:  None. FINDINGS: Lower chest: Lingular nodularity, including a 4 mm subpleural nodule on image 9. Left base scarring. 5 mm left lower lobe pulmonary nodule on image 5. Tiny hiatal hernia. Normal heart size without pericardial or pleural effusion. Periesophageal adenopathy is moderate at 12 mm on image 1/ series 201. Hepatobiliary: Normal liver. Normal gallbladder, without biliary ductal dilatation. Pancreas: Normal, without mass or ductal dilatation. Spleen: Normal in size, without focal abnormality. Adrenals/Urinary Tract: Normal adrenal glands. Normal kidneys, without hydronephrosis. Normal urinary bladder. Stomach/Bowel: Normal remainder of the stomach. Normal colon and terminal ileum. Appendix is enlarged and inflamed, including on transverse image 51 and coronal image 57. Example 13 mm. No surrounding abscess or free perforation. Normal small bowel. Vascular/Lymphatic: Aortic and branch vessel atherosclerosis. No abdominopelvic adenopathy. Reproductive: Normal uterus and adnexa. Other: No significant free fluid. Musculoskeletal: No acute osseous abnormality. Prominent disc bulge at L4-5. IMPRESSION: 1. Uncomplicated appendicitis. These results were called by telephone at the time of interpretation on 12/09/2015 at 10:32 pm to Dr. DADeno Etienne who verbally acknowledged these results. 2. Small hiatal hernia with adjacent periesophageal adenopathy. This could be reactive. Correlate with symptoms to suggest esophagitis. Consider endoscopy, given the extent of adenopathy. 3. Pulmonary nodules measuring maximally 5 mm. No follow-up needed if patient is low-risk. Non-contrast chest  CT can be considered in 12 months if patient is high-risk. This recommendation follows the consensus statement: Guidelines for Management of Incidental Pulmonary Nodules Detected on CT Images:From the Fleischner Society 2017; published online before print (10.1148/[id number] Electronically Signed   By: KyAbigail Miyamoto.D.   On: 12/09/2015 22:35    Review of Systems  Constitutional: Positive for fever. Negative for chills and weight loss.  HENT: Negative.   Eyes: Negative.   Respiratory: Negative.  Cardiovascular: Negative.   Gastrointestinal: Positive for nausea and abdominal pain. Negative for diarrhea and constipation.  Genitourinary: Negative.   Musculoskeletal: Negative.   Skin: Negative.   Neurological: Negative.   Endo/Heme/Allergies: Negative.   Psychiatric/Behavioral: Negative.     Blood pressure 138/86, pulse 98, temperature 98.3 F (36.8 C), temperature source Oral, resp. rate 16, SpO2 96 %. Physical Exam  Constitutional: She is oriented to person, place, and time. She appears well-developed and well-nourished. No distress.  HENT:  Head: Normocephalic and atraumatic.  Right Ear: External ear normal.  Left Ear: External ear normal.  Eyes: Conjunctivae are normal. Pupils are equal, round, and reactive to light. No scleral icterus.  Neck: Normal range of motion. Neck supple.  Cardiovascular: Normal rate, regular rhythm and intact distal pulses.   Respiratory: Effort normal. No respiratory distress. She exhibits no tenderness.  GI: Soft. She exhibits distension (slightly distended). There is tenderness (tender RLQ). There is guarding (voluntary guarding RLQ).  Musculoskeletal: Normal range of motion.  Neurological: She is alert and oriented to person, place, and time. Coordination normal.  Skin: Skin is warm and dry. No rash noted. She is not diaphoretic. No erythema. No pallor.  Psychiatric: She has a normal mood and affect. Her behavior is normal. Judgment and  thought content normal.    Assessment/Plan Acute appendicitis  NPO IVF IV antibiotics To OR in AM with Dr. Brantley Stage for lap appy.   Reviewed surgery, risks, benefits, recovery. Pt wishes to proceed.   Appendectomy was described to the patient and her husband.  The incisions and surgical technique were explained.  The patient was advised that some of the hair on the abdomen would be clipped, and that a foley catheter would be placed.  I advised the patient of the risks of surgery including, but not limited to, bleeding, infection, damage to other structures, risk of an open operation, risk of abscess, and risk of blood clot.  The recovery was also described to the patient.  She was advised that he will have lifting restrictions for 2 weeks.  We discussed that she may need to adjust her travel plans.     OF NOTE, PATIENT'S SISTER HAS PSEUDOCHOLINESTERASE DEFICIENCY.    Stark Klein, MD 12/10/2015, 12:03 AM

## 2015-12-10 NOTE — Transfer of Care (Signed)
Immediate Anesthesia Transfer of Care Note  Patient: Tiffany Singh  Procedure(s) Performed: Procedure(s): APPENDECTOMY LAPAROSCOPIC (N/A)  Patient Location: PACU  Anesthesia Type:General  Level of Consciousness: awake, alert  and oriented  Airway & Oxygen Therapy: Patient Spontanous Breathing and Patient connected to nasal cannula oxygen  Post-op Assessment: Report given to RN, Post -op Vital signs reviewed and stable and Patient moving all extremities  Post vital signs: Reviewed and stable  Last Vitals:  Filed Vitals:   12/10/15 0329 12/10/15 0505  BP:  102/58  Pulse: 102 102  Temp: 39.5 C 38.9 C  Resp:  18    Complications: No apparent anesthesia complications

## 2015-12-10 NOTE — Anesthesia Postprocedure Evaluation (Signed)
Anesthesia Post Note  Patient: Tiffany ShockJanet L Prieur  Procedure(s) Performed: Procedure(s) (LRB): APPENDECTOMY LAPAROSCOPIC (N/A)  Patient location during evaluation: PACU Anesthesia Type: General Level of consciousness: awake Pain management: pain level controlled Vital Signs Assessment: post-procedure vital signs reviewed and stable Respiratory status: spontaneous breathing Cardiovascular status: stable Anesthetic complications: no    Last Vitals:  Filed Vitals:   12/10/15 0900 12/10/15 0915  BP: 107/61 106/56  Pulse: 92   Temp:    Resp: 16     Last Pain:  Filed Vitals:   12/10/15 0925  PainSc: 6                  EDWARDS,Toiya Morrish

## 2015-12-10 NOTE — Interval H&P Note (Signed)
History and Physical Interval Note:  12/10/2015 7:40 AM  Tiffany Singh  has presented today for surgery, with the diagnosis of acute appendicitis  The various methods of treatment have been discussed with the patient and family. After consideration of risks, benefits and other options for treatment, the patient has consented to  Procedure(s): APPENDECTOMY LAPAROSCOPIC (N/A) as a surgical intervention .  The patient's history has been reviewed, patient examined, no change in status, stable for surgery.  I have reviewed the patient's chart and labs.  Questions were answered to the patient's satisfaction.   Pt seen  Chart reviewed and procedure discussed with the patient and her husband.  Alternatives to surgery also covered.  The procedure has been discussed with the patient.  Alternative therapies have been discussed with the patient.  Operative risks include bleeding,  Infection,  Organ injury,  Nerve injury,  Blood vessel injury,  DVT,  Pulmonary embolism,  Death,  And possible reoperation.  Medical management risks include worsening of present situation.  The success of the procedure is 50 -100 % at treating patients symptoms.  The patient understands and agrees to proceed.  [name] A.

## 2015-12-10 NOTE — Anesthesia Procedure Notes (Signed)
Procedure Name: Intubation Date/Time: 12/10/2015 7:48 AM Performed by: Edmonia CaprioAUSTON, Tiffany Exton M Pre-anesthesia Checklist: Patient identified, Timeout performed, Emergency Drugs available, Suction available and Patient being monitored Patient Re-evaluated:Patient Re-evaluated prior to inductionOxygen Delivery Method: Circle system utilized Preoxygenation: Pre-oxygenation with 100% oxygen Intubation Type: IV induction, Cricoid Pressure applied and Rapid sequence Ventilation: Mask ventilation without difficulty Laryngoscope Size: Miller and 2 Grade View: Grade I Tube type: Oral Number of attempts: 1 Airway Equipment and Method: Stylet Placement Confirmation: ETT inserted through vocal cords under direct vision,  breath sounds checked- equal and bilateral and positive ETCO2 Secured at: 21 cm Tube secured with: Tape Dental Injury: Teeth and Oropharynx as per pre-operative assessment  Comments: Modified RSI with zemuron (family history of pseudocholinesterase deficiency).  Cords open/ clear on DL.

## 2015-12-11 ENCOUNTER — Encounter (HOSPITAL_COMMUNITY): Payer: Self-pay | Admitting: Surgery

## 2015-12-11 MED ORDER — OXYCODONE HCL 5 MG PO TABS: 5.0000 mg | ORAL_TABLET | Freq: Four times a day (QID) | ORAL | Status: DC | PRN

## 2015-12-11 MED ORDER — ACETAMINOPHEN 325 MG PO TABS: 650.0000 mg | ORAL_TABLET | Freq: Four times a day (QID) | ORAL | Status: DC | PRN

## 2015-12-11 MED ORDER — AMOXICILLIN-POT CLAVULANATE 875-125 MG PO TABS: 1.0000 | ORAL_TABLET | Freq: Two times a day (BID) | ORAL | Status: DC

## 2015-12-11 MED ORDER — SACCHAROMYCES BOULARDII 250 MG PO CAPS
250.0000 mg | ORAL_CAPSULE | Freq: Two times a day (BID) | ORAL | Status: DC
Start: 2015-12-11 — End: 2018-08-18

## 2015-12-11 MED ORDER — DOCUSATE SODIUM 100 MG PO CAPS: 100.0000 mg | ORAL_CAPSULE | Freq: Two times a day (BID) | ORAL | Status: AC | PRN

## 2015-12-11 NOTE — Discharge Summary (Signed)
[facility] Discharge Summary   Patient ID: Tiffany Singh MRN: [id number] DOB/AGE: [birth year] 60 y.o.  Admit date: 12/09/2015 Discharge date: 12/11/2015  Admitting Diagnosis: Acute appendicitis  Discharge Diagnosis Patient Active Problem List   Diagnosis Date Noted  . Acute appendicitis 12/10/2015    Consultants None  Imaging: Ct Abdomen Pelvis W Contrast  12/09/2015  CLINICAL DATA:  Right lower quadrant pain. EXAM: CT ABDOMEN AND PELVIS WITH CONTRAST TECHNIQUE: Multidetector CT imaging of the abdomen and pelvis was performed using the standard protocol following bolus administration of intravenous contrast. CONTRAST:  ISOVUE-300 IOPAMIDOL (ISOVUE-300) INJECTION 61%, 25mL OMNIPAQUE IOHEXOL 300 MG/ML SOLN COMPARISON:  None. FINDINGS: Lower chest: Lingular nodularity, including a 4 mm subpleural nodule on image 9. Left base scarring. 5 mm left lower lobe pulmonary nodule on image 5. Tiny hiatal hernia. Normal heart size without pericardial or pleural effusion. Periesophageal adenopathy is moderate at 12 mm on image 1/ series 201. Hepatobiliary: Normal liver. Normal gallbladder, without biliary ductal dilatation. Pancreas: Normal, without mass or ductal dilatation. Spleen: Normal in size, without focal abnormality. Adrenals/Urinary Tract: Normal adrenal glands. Normal kidneys, without hydronephrosis. Normal urinary bladder. Stomach/Bowel: Normal remainder of the stomach. Normal colon and terminal ileum. Appendix is enlarged and inflamed, including on transverse image 51 and coronal image 57. Example 13 mm. No surrounding abscess or free perforation. Normal small bowel. Vascular/Lymphatic: Aortic and branch vessel atherosclerosis. No abdominopelvic adenopathy. Reproductive: Normal uterus and adnexa. Other: No significant free fluid. Musculoskeletal: No acute osseous abnormality. Prominent disc bulge at L4-5. IMPRESSION: 1. Uncomplicated appendicitis. These results were called by  telephone at the time of interpretation on 12/09/2015 at 10:32 pm to Dr. Melene Plan , who verbally acknowledged these results. 2. Small hiatal hernia with adjacent periesophageal adenopathy. This could be reactive. Correlate with symptoms to suggest esophagitis. Consider endoscopy, given the extent of adenopathy. 3. Pulmonary nodules measuring maximally 5 mm. No follow-up needed if patient is low-risk. Non-contrast chest CT can be considered in 12 months if patient is high-risk. This recommendation follows the consensus statement: Guidelines for Management of Incidental Pulmonary Nodules Detected on CT Images:From the Fleischner Society 2017; published online before print (10.1148/radiol.4098119147). Electronically Signed   By: Jeronimo Greaves M.D.   On: 12/09/2015 22:35    Procedures Dr. Dwain Sarna (12/11/15) - Laparoscopic Appendectomy  Hospital Course:  60 y/o female who presents with 24 hours of abdominal pain. This started in the RLQ. It was worsening through the day. She was able to eat a small lunch, but no dinner. She describes the pain and crampy and sharp. She had a low grade fever, but no rigors. She denies n/v/d/constipation. She has never had pain like this before.   Workup showed acute appendicitis.  Patient was admitted and underwent procedure listed above.  Tolerated procedure well and was transferred to the floor.  She did have a perforation at the base of the appendix.  Diet was advanced as tolerated.   On POD #1, the patient was voiding well, tolerating diet, ambulating well, pain well controlled, vital signs stable, incisions c/d/i and felt stable for discharge home.  Patient will follow up in our office in 2-3 weeks and knows to call with questions or concerns.  She will continue 5 additional days of Augmentin upon discharge.  She is encouraged to use a bowel regimen, metamucil, +/- probiotic as needed.   Physical Exam: General:  Alert, NAD, pleasant, comfortable Abd:  Soft, ND, mild  tenderness, incisions C/D/I  Medication List    TAKE these medications        acetaminophen 325 MG tablet  Commonly known as:  TYLENOL  Take 2 tablets (650 mg total) by mouth every 6 (six) hours as needed for mild pain (or temp > 100).     amoxicillin-clavulanate 875-125 MG tablet  Commonly known as:  AUGMENTIN  Take 1 tablet by mouth 2 (two) times daily.     docusate sodium 100 MG capsule  Commonly known as:  COLACE  Take 1 capsule (100 mg total) by mouth 2 (two) times daily as needed.     fluticasone 50 MCG/ACT nasal spray  Commonly known as:  FLONASE  Place 1 spray into both nostrils daily as needed for allergies or rhinitis.     loratadine 10 MG tablet  Commonly known as:  CLARITIN  Take 10 mg by mouth daily as needed for allergies.     magnesium gluconate 500 MG tablet  Commonly known as:  MAGONATE  Take 500 mg by mouth at bedtime.     OVER THE COUNTER MEDICATION  Take 1 tablet by mouth daily as needed (for supplementation).     oxyCODONE 5 MG immediate release tablet  Commonly known as:  Oxy IR/ROXICODONE  Take 1-2 tablets (5-10 mg total) by mouth every 6 (six) hours as needed for moderate pain.     saccharomyces boulardii 250 MG capsule  Commonly known as:  FLORASTOR  Take 1 capsule (250 mg total) by mouth 2 (two) times daily.         Follow-up Information    [facility] On 12/[facility]/2015.   Specialty:  General Surgery   Why:  For post-operation check. Your appointment is at 3:15pm, please arrive at least 30min before your appointment to complete your check in paperwork.  If you are unable to arrive 30min prior to your appointment time we may have to cancel or reschedule you.   Contact information:   [address] [place] [id number] [phone]      Signed: Bradd CanaryMegan N. Edelmira Gallogly, PA-C [facility] [phone]  12/11/2015, 9:31 AM

## 2015-12-11 NOTE — Progress Notes (Signed)
pATIENT DISCHARGED TO HOME WITH INSTRUCTIONS.

## 2015-12-11 NOTE — Discharge Instructions (Signed)

## 2018-05-21 ENCOUNTER — Other Ambulatory Visit (HOSPITAL_COMMUNITY)
Admission: RE | Admit: 2018-05-21 | Discharge: 2018-05-21 | Disposition: A | Discharge status: Home / Self Care | Payer: Managed Care, Other (non HMO) | Source: Ambulatory Visit | Attending: Family Medicine | Admitting: Family Medicine

## 2018-05-21 ENCOUNTER — Other Ambulatory Visit: Payer: Self-pay | Admitting: Family Medicine

## 2018-05-21 DIAGNOSIS — Z01411 Encounter for gynecological examination (general) (routine) with abnormal findings: Secondary | ICD-10-CM | POA: Insufficient documentation

## 2018-05-22 LAB — CYTOLOGY - PAP
Diagnosis: NEGATIVE
HPV: NOT DETECTED

## 2018-08-15 ENCOUNTER — Other Ambulatory Visit: Payer: Self-pay

## 2018-08-15 ENCOUNTER — Encounter (HOSPITAL_BASED_OUTPATIENT_CLINIC_OR_DEPARTMENT_OTHER): Payer: Self-pay | Admitting: *Deleted

## 2018-08-15 ENCOUNTER — Observation Stay (HOSPITAL_BASED_OUTPATIENT_CLINIC_OR_DEPARTMENT_OTHER)
Admission: EM | Admit: 2018-08-15 | Discharge: 2018-08-18 | Disposition: A | Discharge status: Home / Self Care | Payer: 59 | Attending: Orthopaedic Surgery | Admitting: Orthopaedic Surgery

## 2018-08-15 ENCOUNTER — Emergency Department (HOSPITAL_BASED_OUTPATIENT_CLINIC_OR_DEPARTMENT_OTHER): Payer: 59

## 2018-08-15 DIAGNOSIS — S82892A Other fracture of left lower leg, initial encounter for closed fracture: Secondary | ICD-10-CM | POA: Diagnosis present

## 2018-08-15 DIAGNOSIS — Y93H1 Activity, digging, shoveling and raking: Secondary | ICD-10-CM | POA: Diagnosis not present

## 2018-08-15 DIAGNOSIS — W108XXA Fall (on) (from) other stairs and steps, initial encounter: Secondary | ICD-10-CM | POA: Diagnosis not present

## 2018-08-15 DIAGNOSIS — Z7951 Long term (current) use of inhaled steroids: Secondary | ICD-10-CM | POA: Insufficient documentation

## 2018-08-15 DIAGNOSIS — S82202A Unspecified fracture of shaft of left tibia, initial encounter for closed fracture: Secondary | ICD-10-CM

## 2018-08-15 DIAGNOSIS — R52 Pain, unspecified: Secondary | ICD-10-CM | POA: Diagnosis present

## 2018-08-15 DIAGNOSIS — S82842A Displaced bimalleolar fracture of left lower leg, initial encounter for closed fracture: Principal | ICD-10-CM | POA: Insufficient documentation

## 2018-08-15 DIAGNOSIS — Y92017 Garden or yard in single-family (private) house as the place of occurrence of the external cause: Secondary | ICD-10-CM | POA: Insufficient documentation

## 2018-08-15 DIAGNOSIS — Z419 Encounter for procedure for purposes other than remedying health state, unspecified: Secondary | ICD-10-CM

## 2018-08-15 DIAGNOSIS — W19XXXA Unspecified fall, initial encounter: Secondary | ICD-10-CM

## 2018-08-15 DIAGNOSIS — S8263XA Displaced fracture of lateral malleolus of unspecified fibula, initial encounter for closed fracture: Secondary | ICD-10-CM

## 2018-08-15 DIAGNOSIS — S82402A Unspecified fracture of shaft of left fibula, initial encounter for closed fracture: Secondary | ICD-10-CM

## 2018-08-15 DIAGNOSIS — I10 Essential (primary) hypertension: Secondary | ICD-10-CM | POA: Insufficient documentation

## 2018-08-15 DIAGNOSIS — S82409A Unspecified fracture of shaft of unspecified fibula, initial encounter for closed fracture: Secondary | ICD-10-CM

## 2018-08-15 DIAGNOSIS — S82209A Unspecified fracture of shaft of unspecified tibia, initial encounter for closed fracture: Secondary | ICD-10-CM | POA: Diagnosis present

## 2018-08-15 LAB — CBC WITH DIFFERENTIAL/PLATELET
ABS IMMATURE GRANULOCYTES: 0.04 10*3/uL (ref 0.00–0.07)
BASOS PCT: 0 %
Basophils Absolute: 0 10*3/uL (ref 0.0–0.1)
Eosinophils Absolute: 0.1 10*3/uL (ref 0.0–0.5)
Eosinophils Relative: 1 %
HEMATOCRIT: 43.7 % (ref 36.0–46.0)
HEMOGLOBIN: 14.6 g/dL (ref 12.0–15.0)
Immature Granulocytes: 1 %
LYMPHS ABS: 1.1 10*3/uL (ref 0.7–4.0)
Lymphocytes Relative: 14 %
MCH: 29.7 pg (ref 26.0–34.0)
MCHC: 33.4 g/dL (ref 30.0–36.0)
MCV: 88.8 fL (ref 80.0–100.0)
MONO ABS: 0.7 10*3/uL (ref 0.1–1.0)
MONOS PCT: 8 %
NEUTROS ABS: 6.2 10*3/uL (ref 1.7–7.7)
Neutrophils Relative %: 76 %
Platelets: 314 10*3/uL (ref 150–400)
RBC: 4.92 MIL/uL (ref 3.87–5.11)
RDW: 13.7 % (ref 11.5–15.5)
WBC: 8 10*3/uL (ref 4.0–10.5)
nRBC: 0 % (ref 0.0–0.2)

## 2018-08-15 LAB — BASIC METABOLIC PANEL
ANION GAP: 10 (ref 5–15)
BUN: 17 mg/dL (ref 8–23)
CALCIUM: 9 mg/dL (ref 8.9–10.3)
CO2: 21 mmol/L — ABNORMAL LOW (ref 22–32)
CREATININE: 0.69 mg/dL (ref 0.44–1.00)
Chloride: 104 mmol/L (ref 98–111)
Glucose, Bld: 106 mg/dL — ABNORMAL HIGH (ref 70–99)
Potassium: 3.7 mmol/L (ref 3.5–5.1)
SODIUM: 135 mmol/L (ref 135–145)

## 2018-08-15 MED ORDER — OXYCODONE-ACETAMINOPHEN 5-325 MG PO TABS
1.0000 | ORAL_TABLET | ORAL | Status: DC | PRN
  Administered 2018-08-15: 1 via ORAL
  Filled 2018-08-15: qty 1

## 2018-08-15 MED ORDER — MORPHINE BOLUS VIA INFUSION
2.0000 mg | INTRAVENOUS | Status: DC | PRN
  Filled 2018-08-15: qty 2

## 2018-08-15 MED ORDER — OXYCODONE-ACETAMINOPHEN 5-325 MG PO TABS
1.0000 | ORAL_TABLET | ORAL | Status: AC | PRN
  Administered 2018-08-15 (×2): 1 via ORAL
  Filled 2018-08-15 (×2): qty 1

## 2018-08-15 MED ORDER — HYDROCODONE-ACETAMINOPHEN 7.5-325 MG PO TABS
1.0000 | ORAL_TABLET | ORAL | Status: DC | PRN
  Filled 2018-08-15: qty 2

## 2018-08-15 MED ORDER — SODIUM CHLORIDE 0.45 % IV SOLN
INTRAVENOUS | Status: DC
  Administered 2018-08-16: 01:00:00 via INTRAVENOUS

## 2018-08-15 MED ORDER — MORPHINE SULFATE (PF) 2 MG/ML IV SOLN
2.0000 mg | INTRAVENOUS | Status: DC | PRN
  Administered 2018-08-16 (×3): 2 mg via INTRAVENOUS
  Filled 2018-08-15 (×3): qty 1

## 2018-08-15 NOTE — ED Provider Notes (Addendum)
[facility] EMERGENCY DEPARTMENT Provider Note   CSN: [id number] Arrival date & time: 08/15/18  1450     History   Chief Complaint Chief Complaint  Patient presents with  . Leg Injury    HPI Tiffany Singh is a 62 y.o. female without significant past medical hx who presents to the ED status post mechanical fall this afternoon with bilateral ankle pain.  Patient states she was blowing leaves and tripped on a step resulting in a fall.  Denies prodromal lightheadedness, dizziness, chest pain, or difficulty breathing.  Denies head injury or loss of consciousness.  She states that her left ankle dislocated and she popped it back into place.  She states that she was unable to get up on her own secondary to pain.  Pain only to the ankles bilaterally, left worse than right.  Current pain is improved following Percocet per triage.  Worse with movement. Associated left ankle swelling.  Denies neck pain, back pain, headache, numbness, weakness, or paresthesias.   HPI  History reviewed. No pertinent past medical history.  Patient Active Problem List   Diagnosis Date Noted  . Acute appendicitis 12/10/2015    Past Surgical History:  Procedure Laterality Date  . CESAREAN SECTION    . LAPAROSCOPIC APPENDECTOMY N/A 12/10/2015   Procedure: APPENDECTOMY LAPAROSCOPIC;  Surgeon: Harriette Bouillon, MD;  Location: [facility] OR;  Service: General;  Laterality: N/A;     OB History   None      Home Medications    Prior to Admission medications   Medication Sig Start Date End Date Taking? Authorizing Provider  acetaminophen (TYLENOL) 325 MG tablet Take 2 tablets (650 mg total) by mouth every 6 (six) hours as needed for mild pain (or temp > 100). 12/11/15   Nonie Hoyer, PA-C  amoxicillin-clavulanate (AUGMENTIN) 875-125 MG tablet Take 1 tablet by mouth 2 (two) times daily. 12/11/15   Nonie Hoyer, PA-C  fluticasone (FLONASE) 50 MCG/ACT nasal spray Place 1 spray into both nostrils daily as needed  for allergies or rhinitis.    [provider]  loratadine (CLARITIN) 10 MG tablet Take 10 mg by mouth daily as needed for allergies.    [provider]  magnesium gluconate (MAGONATE) 500 MG tablet Take 500 mg by mouth at bedtime.    [provider]  OVER THE COUNTER MEDICATION Take 1 tablet by mouth daily as needed (for supplementation).    [provider]  oxyCODONE (OXY IR/ROXICODONE) 5 MG immediate release tablet Take 1-2 tablets (5-10 mg total) by mouth every 6 (six) hours as needed for moderate pain. 12/11/15   Nonie Hoyer, PA-C  saccharomyces boulardii (FLORASTOR) 250 MG capsule Take 1 capsule (250 mg total) by mouth 2 (two) times daily. 12/11/15   Nonie Hoyer, PA-C    Family History No family history on file.  Social History Social History   Tobacco Use  . Smoking status: Never Smoker  . Smokeless tobacco: Never Used  Substance Use Topics  . Alcohol use: Yes    Comment: wine weekly  . Drug use: No     Allergies   Scallops [shellfish allergy] and Z-pak [azithromycin]   Review of Systems Review of Systems  Respiratory: Negative for shortness of breath.   Cardiovascular: Negative for chest pain.  Musculoskeletal: Positive for arthralgias (bilateral ankles) and joint swelling (L ankle).  Neurological: Negative for dizziness, syncope, weakness, light-headedness and numbness.  All other systems reviewed and are negative.    Physical  Exam Updated Vital Signs BP 131/89 (BP Location: Right Arm)   Pulse 84   Temp 98.2 F (36.8 C) (Oral)   Resp 18   Ht 5\' 3"  (1.6 m)   Wt 70.3 kg   SpO2 98%   BMI 27.46 kg/m   Physical Exam  Constitutional: She appears well-developed and well-nourished.  Non-toxic appearance. No distress.  HENT:  Head: Normocephalic and atraumatic. Head is without raccoon's eyes and without Battle's sign.  Eyes: Conjunctivae are normal. Right eye exhibits no discharge. Left eye exhibits no discharge.  Neck:  Normal range of motion. Neck supple. No spinous process tenderness present.  Cardiovascular: Normal rate and regular rhythm.  Pulses:      Dorsalis pedis pulses are 2+ on the right side, and 2+ on the left side.       Posterior tibial pulses are 2+ on the right side, and 2+ on the left side.  Pulmonary/Chest: Effort normal and breath sounds normal. No respiratory distress. She has no wheezes. She has no rhonchi. She has no rales.  Respiration even and unlabored  Abdominal: Soft. She exhibits no distension. There is no tenderness.  Musculoskeletal:  Back: No palpable midline tenderness or palpable step-off. Upper extremities: Normal AROM. nontender Lower extremities: Patient has significant soft tissue edema with ecchymosis to the left ankle, more so laterally extending to the distal one third of the tib/fib.  She has full active range of motion to bilateral hips, knees, and all digits.  She has full active range of motion of the right ankle.  Left ankle with majority of range of motion intact, mildly limited secondary to pain.  She is tender palpation over the right lateral malleolus and lateral ligaments.  Is tender to palpation over the left medial malleolus, lateral malleolus, and the distal aspect of the fibula.  There is no fibular head tenderness.  There is no tenderness to the base of the fifth or the navicular bone.  Lower extremities are otherwise completely nontender.  Neurovascularly intact distally.  Neurological: She is alert.  Clear speech.  Sensation grossly intact bilateral lower extremities.  She has good strength with plantar and dorsiflexion bilaterally.  Ambulation deferred upon initial assessment secondary to pain.  Skin: Skin is warm and dry. Capillary refill takes less than 2 seconds. No rash noted.  Psychiatric: She has a normal mood and affect. Her behavior is normal.  Nursing note and vitals reviewed.    ED Treatments / Results  Labs (all labs ordered are listed, but  only abnormal results are displayed) Labs Reviewed - No data to display  EKG None  Radiology Dg Ankle Complete Left  Result Date: 08/15/2018 CLINICAL DATA:  Fall, ankle injury EXAM: LEFT ANKLE COMPLETE - 3+ VIEW COMPARISON:  None. FINDINGS: Mildly comminuted oblique distal fibular shaft fracture. Minimally displaced transverse medial malleolar fracture. The ankle mortise is intact. The base of the fifth metatarsal is unremarkable. Mild lateral soft tissue swelling. IMPRESSION: Mildly comminuted oblique distal fibular shaft fracture. Minimally displaced transverse medial malleolar fracture. Electronically Signed   By: Charline Bills M.D.   On: 08/15/2018 16:27   Dg Ankle Complete Right  Result Date: 08/15/2018 CLINICAL DATA:  Larey Seat today while blowing lesions and injured both ankles. Pain and swelling. EXAM: RIGHT ANKLE - COMPLETE 3+ VIEW COMPARISON:  None. FINDINGS: There is no evidence of fracture, dislocation, or joint effusion. There is no evidence of arthropathy or other focal bone abnormality. Soft tissues are unremarkable. IMPRESSION: Negative. Electronically Signed  By: Kennith CenterEric  Mansell M.D.   On: 08/15/2018 16:27    Procedures Procedures (including critical care time)  SPLINT APPLICATION Date/Time: 5:46 PM Authorized by: Harvie HeckSamantha Chaquetta Schlottman Consent: Verbal consent obtained. Risks and benefits: risks, benefits and alternatives were discussed Consent given by: patient Splint applied by: ED technician Location details: LLE Splint type: short leg posterior w/ stirrups Post-procedure: The splinted body part was neurovascularly unchanged following the procedure. Patient tolerance: Patient tolerated the procedure well with no immediate complications.  SPLINT APPLICATION Date/Time: 5:46 PM Authorized by: Harvie HeckSamantha Garet Hooton Consent: Verbal consent obtained. Risks and benefits: risks, benefits and alternatives were discussed Consent given by: patient Splint applied by: ED  technician Location details: RLE Splint type: ASO Post-procedure: The splinted body part was neurovascularly unchanged following the procedure. Patient tolerance: Patient tolerated the procedure well with no immediate complications.    Medications Ordered in ED Medications  oxyCODONE-acetaminophen (PERCOCET/ROXICET) 5-325 MG per tablet 1 tablet (1 tablet Oral Given 08/15/18 1508)     Initial Impression / Assessment and Plan / ED Course  I have reviewed the triage vital signs and the nursing notes.  Pertinent labs & imaging results that were available during my care of the patient were reviewed by me and considered in my medical decision making (see chart for details).    Patient presents to the ED s/p mechanical fall with bilateral ankle pain. Initialy tachycardia/HTN resolved on my exam and w/ repeat vitals. No evidence of serious head/neck/back injury. R ankle xray negative. L ankle xray with  Mildly comminuted oblique distal fibular shaft fracture. Minimally displaced transverse medial malleolar fracture. NVI distally.   Initially splinted L ankle and placed ASO on R ankle and attempted ambulation with crutches following pain control per discussion with Dr. Donnald GarrePfeiffer. Attempt unsuccessful, patient unable to bear weight, and therefore unable to ambulate will discuss with orthopedics team.   19:08: CONSULT: Discussed with orthopedic surgeon Dr. Ophelia CharterYates- recommends trial with walker and call back if unsuccessful.   Patient attempted ambulation, minimally mobile, she does not feel able to transfer at home, she also does not feel safe going home especially with her husband out of town and her current instability.   20:25: CONSULT: Re-discussed with Dr. Ophelia CharterYates who accepts admission. Will likely operate tomorrow. Requesting basic labs and that we place temporary admission orders.   Patient updated on plan, she is in agreement.   Findings and plan of care discussed with supervising physician Dr.  Donnald GarrePfeiffer who is in agreement.   Vitals:   08/15/18 1500 08/15/18 1820  BP: (!) 142/131 131/89  Pulse: (!) 114 84  Resp: 20 18  Temp: 98.2 F (36.8 C)   SpO2: 98% 98%   21:55: [facility]- rediscussed with Dr. Ophelia CharterYates- [facility]   Final Clinical Impressions(s) / ED Diagnoses   Final diagnoses:  Closed fracture of left tibia and fibula, initial encounter  Fall, initial encounter    ED Discharge Orders    None       Cherly Andersonetrucelli, Claudie Rathbone R, PA-C 08/15/18 2056    Desmond Lopeetrucelli, Recia Sons R, PA-C 08/15/18 2159    Arby BarrettePfeiffer, Marcy, MD 08/20/18 [phone]

## 2018-08-15 NOTE — ED Notes (Signed)
Attempted to give report to floor, RN unavailable at this time

## 2018-08-15 NOTE — ED Notes (Signed)
[facility] notified (Tammy) - repage Dr. Ophelia CharterYates regarding [facility]

## 2018-08-15 NOTE — Progress Notes (Signed)
Patient ID: Tiffany Singh, female   DOB: 10-24-1955, 62 y.o.   MRN: [id number] Pt with fall and left bimalleolar fracture and syndosmosis disruption.[facility] and was not able to transfer with crutches or walker due to pain. She has opposite right ankle sprain with negative right ankle xrays.  [facility] , [facility] beds available. [facility] Sunday afternoon at about 4 :30 PM. ( multiple AM cases already posted ) . [facility]. Orders place in Epic. I will see her in AM Sunday morning to discuss planned surgery for her unstable left ankle fracture.    My cell [phone]

## 2018-08-15 NOTE — ED Triage Notes (Signed)
Pt reports she fell blowing leaves and injured both ankles. Left ankle +deformity, foot warm, pulse present, cap refill <3 sec

## 2018-08-15 NOTE — ED Notes (Signed)
Pt attempted to ambulate with crutches and walker. Pt did not tolerate crutches. Pt able to take one step with walker but with assistance to bilat sides. Pt voices feeling unsafe going home since husband is out of town. Friend and children at bedside.

## 2018-08-15 NOTE — ED Notes (Signed)
Pt given bedpan to take home, family member willing to assist pt at home with it.

## 2018-08-15 NOTE — H&P (View-Only) (Signed)
Patient ID: Tiffany Singh, female   DOB: 03/27/1956, 62 y.o.   MRN: [id number] Pt with fall and left bimalleolar fracture and syndosmosis disruption.presented to [facility] and was not able to transfer with crutches or walker due to pain. She has opposite right ankle sprain with negative right ankle xrays.  [facility] to be transferred to [facility] , no beds available. Will be transferred to [facility] for surgery Sunday afternoon at about 4 :30 PM. ( multiple AM cases already posted ) . Foot elevation when she arrives at [facility]. Orders place in Epic. I will see her in AM Sunday morning to discuss planned surgery for her unstable left ankle fracture.    My cell [phone] 

## 2018-08-16 ENCOUNTER — Observation Stay (HOSPITAL_COMMUNITY): Payer: 59

## 2018-08-16 ENCOUNTER — Observation Stay (HOSPITAL_COMMUNITY): Payer: 59 | Admitting: Anesthesiology

## 2018-08-16 ENCOUNTER — Encounter (HOSPITAL_COMMUNITY)
Admission: EM | Disposition: A | Discharge status: Home / Self Care | Payer: Self-pay | Source: Home / Self Care | Attending: Emergency Medicine

## 2018-08-16 DIAGNOSIS — S82892A Other fracture of left lower leg, initial encounter for closed fracture: Secondary | ICD-10-CM | POA: Diagnosis present

## 2018-08-16 DIAGNOSIS — S8263XA Displaced fracture of lateral malleolus of unspecified fibula, initial encounter for closed fracture: Secondary | ICD-10-CM

## 2018-08-16 DIAGNOSIS — S82402A Unspecified fracture of shaft of left fibula, initial encounter for closed fracture: Secondary | ICD-10-CM

## 2018-08-16 DIAGNOSIS — S82202A Unspecified fracture of shaft of left tibia, initial encounter for closed fracture: Secondary | ICD-10-CM

## 2018-08-16 DIAGNOSIS — S93401A Sprain of unspecified ligament of right ankle, initial encounter: Secondary | ICD-10-CM

## 2018-08-16 HISTORY — PX: ORIF ANKLE FRACTURE: SHX5408

## 2018-08-16 LAB — MRSA PCR SCREENING: MRSA by PCR: NEGATIVE

## 2018-08-16 LAB — CBC
HCT: 40.5 % (ref 36.0–46.0)
Hemoglobin: 13.4 g/dL (ref 12.0–15.0)
MCH: 29.9 pg (ref 26.0–34.0)
MCHC: 33.1 g/dL (ref 30.0–36.0)
MCV: 90.4 fL (ref 80.0–100.0)
Platelets: 258 10*3/uL (ref 150–400)
RBC: 4.48 MIL/uL (ref 3.87–5.11)
RDW: 14 % (ref 11.5–15.5)
WBC: 7.4 10*3/uL (ref 4.0–10.5)
nRBC: 0 % (ref 0.0–0.2)

## 2018-08-16 LAB — CREATININE, SERUM
CREATININE: 0.47 mg/dL (ref 0.44–1.00)
GFR calc Af Amer: >60 mL/min (ref >60)
GFR calc non Af Amer: >60 mL/min (ref >60)

## 2018-08-16 SURGERY — OPEN REDUCTION INTERNAL FIXATION (ORIF) ANKLE FRACTURE
Anesthesia: General | Site: Ankle | Laterality: Left

## 2018-08-16 MED ORDER — SODIUM CHLORIDE 0.9 % IV SOLN
INTRAVENOUS | Status: DC | PRN
  Administered 2018-08-16: 25 ug/min via INTRAVENOUS

## 2018-08-16 MED ORDER — PHENYLEPHRINE 40 MCG/ML (10ML) SYRINGE FOR IV PUSH (FOR BLOOD PRESSURE SUPPORT)
PREFILLED_SYRINGE | INTRAVENOUS | Status: DC | PRN
  Administered 2018-08-16: 80 ug via INTRAVENOUS
  Administered 2018-08-16 (×2): 120 ug via INTRAVENOUS
  Administered 2018-08-16: 80 ug via INTRAVENOUS

## 2018-08-16 MED ORDER — CHLORHEXIDINE GLUCONATE 4 % EX LIQD: 60.0000 mL | Freq: Once | CUTANEOUS | Status: DC

## 2018-08-16 MED ORDER — HYDROCHLOROTHIAZIDE 12.5 MG PO CAPS
12.5000 mg | ORAL_CAPSULE | Freq: Every day | ORAL | Status: DC
  Administered 2018-08-18: 12.5 mg via ORAL
  Filled 2018-08-16: qty 1

## 2018-08-16 MED ORDER — TRAMADOL HCL 50 MG PO TABS
50.0000 mg | ORAL_TABLET | Freq: Four times a day (QID) | ORAL | Status: DC
  Administered 2018-08-17 – 2018-08-18 (×4): 50 mg via ORAL
  Filled 2018-08-16 (×5): qty 1

## 2018-08-16 MED ORDER — 0.9 % SODIUM CHLORIDE (POUR BTL) OPTIME
TOPICAL | Status: DC | PRN
  Administered 2018-08-16: 1000 mL

## 2018-08-16 MED ORDER — CELECOXIB 200 MG PO CAPS
200.0000 mg | ORAL_CAPSULE | Freq: Two times a day (BID) | ORAL | Status: DC
  Administered 2018-08-16 – 2018-08-18 (×4): 200 mg via ORAL
  Filled 2018-08-16 (×4): qty 1

## 2018-08-16 MED ORDER — HYDROCODONE-ACETAMINOPHEN 7.5-325 MG PO TABS
1.0000 | ORAL_TABLET | ORAL | Status: DC | PRN
  Administered 2018-08-16: 2 via ORAL
  Filled 2018-08-16: qty 2

## 2018-08-16 MED ORDER — PROPOFOL 10 MG/ML IV BOLUS
INTRAVENOUS | Status: AC
  Filled 2018-08-16: qty 20

## 2018-08-16 MED ORDER — ACETAMINOPHEN 500 MG PO TABS
500.0000 mg | ORAL_TABLET | Freq: Four times a day (QID) | ORAL | Status: AC
  Administered 2018-08-17 (×3): 500 mg via ORAL
  Filled 2018-08-16 (×4): qty 1

## 2018-08-16 MED ORDER — EPHEDRINE 5 MG/ML INJ
INTRAVENOUS | Status: AC
  Filled 2018-08-16: qty 10

## 2018-08-16 MED ORDER — POVIDONE-IODINE 10 % EX SWAB: 2.0000 "application " | Freq: Once | CUTANEOUS | Status: DC

## 2018-08-16 MED ORDER — METOCLOPRAMIDE HCL 5 MG PO TABS: 5.0000 mg | ORAL_TABLET | Freq: Three times a day (TID) | ORAL | Status: DC | PRN

## 2018-08-16 MED ORDER — LORATADINE 10 MG PO TABS: 10.0000 mg | ORAL_TABLET | Freq: Every day | ORAL | Status: DC | PRN

## 2018-08-16 MED ORDER — MIDAZOLAM HCL 2 MG/2ML IJ SOLN
INTRAMUSCULAR | Status: AC
  Filled 2018-08-16: qty 2

## 2018-08-16 MED ORDER — BUPIVACAINE HCL (PF) 0.5 % IJ SOLN
INTRAMUSCULAR | Status: DC | PRN
  Administered 2018-08-16: 5 mL

## 2018-08-16 MED ORDER — ONDANSETRON HCL 4 MG PO TABS: 4.0000 mg | ORAL_TABLET | Freq: Four times a day (QID) | ORAL | Status: DC | PRN

## 2018-08-16 MED ORDER — ONDANSETRON HCL 4 MG/2ML IJ SOLN: 4.0000 mg | Freq: Four times a day (QID) | INTRAMUSCULAR | Status: DC | PRN

## 2018-08-16 MED ORDER — SODIUM CHLORIDE 0.45 % IV SOLN
INTRAVENOUS | Status: DC
  Administered 2018-08-16: 12:00:00 via INTRAVENOUS

## 2018-08-16 MED ORDER — DEXAMETHASONE SODIUM PHOSPHATE 10 MG/ML IJ SOLN
INTRAMUSCULAR | Status: AC
  Filled 2018-08-16: qty 1

## 2018-08-16 MED ORDER — BUPIVACAINE HCL (PF) 0.5 % IJ SOLN
INTRAMUSCULAR | Status: AC
  Filled 2018-08-16: qty 30

## 2018-08-16 MED ORDER — METOCLOPRAMIDE HCL 5 MG/ML IJ SOLN: 5.0000 mg | Freq: Three times a day (TID) | INTRAMUSCULAR | Status: DC | PRN

## 2018-08-16 MED ORDER — DEXAMETHASONE SODIUM PHOSPHATE 10 MG/ML IJ SOLN
INTRAMUSCULAR | Status: DC | PRN
  Administered 2018-08-16: 8 mg via INTRAVENOUS

## 2018-08-16 MED ORDER — EPHEDRINE SULFATE-NACL 50-0.9 MG/10ML-% IV SOSY
PREFILLED_SYRINGE | INTRAVENOUS | Status: DC | PRN
  Administered 2018-08-16 (×2): 5 mg via INTRAVENOUS
  Administered 2018-08-16: 10 mg via INTRAVENOUS

## 2018-08-16 MED ORDER — ACETAMINOPHEN 325 MG PO TABS: 325.0000 mg | ORAL_TABLET | Freq: Four times a day (QID) | ORAL | Status: DC | PRN

## 2018-08-16 MED ORDER — HYDROCODONE-ACETAMINOPHEN 5-325 MG PO TABS
1.0000 | ORAL_TABLET | ORAL | Status: DC | PRN
  Administered 2018-08-16: 1 via ORAL
  Administered 2018-08-17 – 2018-08-18 (×4): 2 via ORAL
  Filled 2018-08-16 (×2): qty 2
  Filled 2018-08-16: qty 1
  Filled 2018-08-16 (×2): qty 2

## 2018-08-16 MED ORDER — MIDAZOLAM HCL 2 MG/2ML IJ SOLN
INTRAMUSCULAR | Status: DC | PRN
  Administered 2018-08-16 (×2): 1 mg via INTRAVENOUS

## 2018-08-16 MED ORDER — LACTATED RINGERS IV SOLN
INTRAVENOUS | Status: DC | PRN
  Administered 2018-08-16: 10:00:00 via INTRAVENOUS

## 2018-08-16 MED ORDER — PROPOFOL 10 MG/ML IV BOLUS
INTRAVENOUS | Status: DC | PRN
  Administered 2018-08-16: 20 mg via INTRAVENOUS
  Administered 2018-08-16: 180 mg via INTRAVENOUS

## 2018-08-16 MED ORDER — LIDOCAINE 2% (20 MG/ML) 5 ML SYRINGE
INTRAMUSCULAR | Status: DC | PRN
  Administered 2018-08-16: 70 mg via INTRAVENOUS

## 2018-08-16 MED ORDER — LOSARTAN POTASSIUM-HCTZ 50-12.5 MG PO TABS: 1.0000 | ORAL_TABLET | Freq: Every day | ORAL | Status: DC

## 2018-08-16 MED ORDER — ONDANSETRON HCL 4 MG/2ML IJ SOLN
INTRAMUSCULAR | Status: DC | PRN
  Administered 2018-08-16: 4 mg via INTRAVENOUS

## 2018-08-16 MED ORDER — DOCUSATE SODIUM 100 MG PO CAPS
100.0000 mg | ORAL_CAPSULE | Freq: Two times a day (BID) | ORAL | Status: DC
  Administered 2018-08-16 – 2018-08-18 (×4): 100 mg via ORAL
  Filled 2018-08-16 (×4): qty 1

## 2018-08-16 MED ORDER — FENTANYL CITRATE (PF) 100 MCG/2ML IJ SOLN
INTRAMUSCULAR | Status: AC
  Filled 2018-08-16: qty 2

## 2018-08-16 MED ORDER — ONDANSETRON HCL 4 MG/2ML IJ SOLN
INTRAMUSCULAR | Status: AC
  Filled 2018-08-16: qty 2

## 2018-08-16 MED ORDER — FLUTICASONE PROPIONATE 50 MCG/ACT NA SUSP: 1.0000 | Freq: Every day | NASAL | Status: DC | PRN

## 2018-08-16 MED ORDER — KETOROLAC TROMETHAMINE 30 MG/ML IJ SOLN: 30.0000 mg | Freq: Once | INTRAMUSCULAR | Status: DC | PRN

## 2018-08-16 MED ORDER — LIDOCAINE 2% (20 MG/ML) 5 ML SYRINGE
INTRAMUSCULAR | Status: AC
  Filled 2018-08-16: qty 5

## 2018-08-16 MED ORDER — PHENYLEPHRINE HCL 10 MG/ML IJ SOLN
INTRAMUSCULAR | Status: AC
  Filled 2018-08-16: qty 2

## 2018-08-16 MED ORDER — HYDROMORPHONE HCL 1 MG/ML IJ SOLN: 0.2500 mg | INTRAMUSCULAR | Status: DC | PRN

## 2018-08-16 MED ORDER — PROMETHAZINE HCL 25 MG/ML IJ SOLN: 6.2500 mg | INTRAMUSCULAR | Status: DC | PRN

## 2018-08-16 MED ORDER — PHENYLEPHRINE 40 MCG/ML (10ML) SYRINGE FOR IV PUSH (FOR BLOOD PRESSURE SUPPORT)
PREFILLED_SYRINGE | INTRAVENOUS | Status: AC
  Filled 2018-08-16: qty 10

## 2018-08-16 MED ORDER — MORPHINE SULFATE (PF) 4 MG/ML IV SOLN: 0.5000 mg | INTRAVENOUS | Status: DC | PRN

## 2018-08-16 MED ORDER — LOSARTAN POTASSIUM 50 MG PO TABS
50.0000 mg | ORAL_TABLET | Freq: Every day | ORAL | Status: DC
  Administered 2018-08-18: 50 mg via ORAL
  Filled 2018-08-16: qty 1

## 2018-08-16 MED ORDER — ENOXAPARIN SODIUM 40 MG/0.4ML ~~LOC~~ SOLN
40.0000 mg | SUBCUTANEOUS | Status: DC
  Administered 2018-08-17 – 2018-08-18 (×2): 40 mg via SUBCUTANEOUS
  Filled 2018-08-16 (×2): qty 0.4

## 2018-08-16 MED ORDER — CEFAZOLIN SODIUM-DEXTROSE 2-4 GM/100ML-% IV SOLN
INTRAVENOUS | Status: AC
  Filled 2018-08-16: qty 100

## 2018-08-16 MED ORDER — CEFAZOLIN SODIUM-DEXTROSE 2-3 GM-%(50ML) IV SOLR
INTRAVENOUS | Status: DC | PRN
  Administered 2018-08-16: 2 g via INTRAVENOUS

## 2018-08-16 MED ORDER — CEFAZOLIN SODIUM-DEXTROSE 2-4 GM/100ML-% IV SOLN: 2.0000 g | INTRAVENOUS | Status: DC

## 2018-08-16 MED ORDER — FENTANYL CITRATE (PF) 100 MCG/2ML IJ SOLN
INTRAMUSCULAR | Status: DC | PRN
  Administered 2018-08-16 (×2): 50 ug via INTRAVENOUS

## 2018-08-16 SURGICAL SUPPLY — 42 items
BAG ZIPLOCK 12X15 (MISCELLANEOUS) ×3 IMPLANT
BANDAGE ACE 4X5 VEL STRL LF (GAUZE/BANDAGES/DRESSINGS) ×3 IMPLANT
BANDAGE ACE 6X5 VEL STRL LF (GAUZE/BANDAGES/DRESSINGS) ×3 IMPLANT
BIT DRILL CALIBRATED 2.7 (BIT) ×2 IMPLANT
BIT DRILL CALIBRATED 2.7MM (BIT) ×1
COVER SURGICAL LIGHT HANDLE (MISCELLANEOUS) ×3 IMPLANT
COVER WAND RF STERILE (DRAPES) ×3 IMPLANT
CUFF TOURN SGL QUICK 34 (TOURNIQUET CUFF) ×2
CUFF TRNQT CYL 34X4X40X1 (TOURNIQUET CUFF) ×1 IMPLANT
DECANTER SPIKE VIAL GLASS SM (MISCELLANEOUS) ×3 IMPLANT
DRAPE C-ARM 42X120 X-RAY (DRAPES) ×3 IMPLANT
DRAPE U-SHAPE 47X51 STRL (DRAPES) ×3 IMPLANT
DRSG ADAPTIC 3X8 NADH LF (GAUZE/BANDAGES/DRESSINGS) ×3 IMPLANT
DRSG PAD ABDOMINAL 8X10 ST (GAUZE/BANDAGES/DRESSINGS) ×3 IMPLANT
ELECT REM PT RETURN 15FT ADLT (MISCELLANEOUS) ×3 IMPLANT
GAUZE SPONGE 4X4 12PLY STRL (GAUZE/BANDAGES/DRESSINGS) ×3 IMPLANT
GAUZE XEROFORM 1X8 LF (GAUZE/BANDAGES/DRESSINGS) ×3 IMPLANT
GLOVE BIOGEL PI IND STRL 7.0 (GLOVE) ×2 IMPLANT
GLOVE BIOGEL PI IND STRL 7.5 (GLOVE) ×1 IMPLANT
GLOVE BIOGEL PI INDICATOR 7.0 (GLOVE) ×4
GLOVE BIOGEL PI INDICATOR 7.5 (GLOVE) ×2
GLOVE ORTHO TXT STRL SZ7.5 (GLOVE) ×3 IMPLANT
GOWN STRL REUS W/TWL LRG LVL3 (GOWN DISPOSABLE) ×6 IMPLANT
NEEDLE HYPO 22GX1.5 SAFETY (NEEDLE) ×3 IMPLANT
PACK TOTAL JOINT (CUSTOM PROCEDURE TRAY) ×3 IMPLANT
PAD ABD 8X10 STRL (GAUZE/BANDAGES/DRESSINGS) ×6 IMPLANT
PAD CAST 4YDX4 CTTN HI CHSV (CAST SUPPLIES) ×1 IMPLANT
PADDING CAST COTTON 4X4 STRL (CAST SUPPLIES) ×2
PADDING CAST COTTON 6X4 STRL (CAST SUPPLIES) ×3 IMPLANT
PLATE LOCK 14H 193 BILAT FIB (Plate) ×3 IMPLANT
PROTECTOR NERVE ULNAR (MISCELLANEOUS) ×3 IMPLANT
SCREW CANC FT 12 4.0 (Screw) ×3 IMPLANT
SCREW CANN S THRD/36 4.0 (Screw) ×3 IMPLANT
SCREW CANN S THRD/38 4.0 (Screw) ×3 IMPLANT
SCREW CORTEX 3.5 12MM (Screw) ×14 IMPLANT
SCREW CORTEX 3.5 50MM (Screw) ×3 IMPLANT
SCREW LOCK CORT ST 3.5X12 (Screw) ×7 IMPLANT
SUT ETHILON 4 0 PS 2 18 (SUTURE) ×6 IMPLANT
SUT VIC AB 2-0 CT1 27 (SUTURE) ×2
SUT VIC AB 2-0 CT1 TAPERPNT 27 (SUTURE) ×1 IMPLANT
SYR CONTROL 10ML LL (SYRINGE) ×3 IMPLANT
TOWEL OR 17X26 10 PK STRL BLUE (TOWEL DISPOSABLE) ×3 IMPLANT

## 2018-08-16 NOTE — Anesthesia Postprocedure Evaluation (Signed)
Anesthesia Post Note  Patient: Tiffany ShockJanet L Singh  Procedure(s) Performed: OPEN REDUCTION INTERNAL FIXATION (ORIF) ANKLE FRACTURE (Left Ankle)     Patient location during evaluation: PACU Anesthesia Type: General Level of consciousness: awake and alert Pain management: pain level controlled Vital Signs Assessment: post-procedure vital signs reviewed and stable Respiratory status: spontaneous breathing, nonlabored ventilation, respiratory function stable and patient connected to nasal cannula oxygen Cardiovascular status: blood pressure returned to baseline and stable Postop Assessment: no apparent nausea or vomiting Anesthetic complications: no    Last Vitals:  Vitals:   08/16/18 1200 08/16/18 1215  BP: 107/66 (!) 104/53  Pulse: 92   Resp: 12   Temp:    SpO2: 99%     Last Pain:  Vitals:   08/16/18 1215  TempSrc:   PainSc: 0-No pain                 Lillianah Swartzentruber S

## 2018-08-16 NOTE — Anesthesia Procedure Notes (Signed)
Anesthesia Regional Block: Popliteal block   Pre-Anesthetic Checklist: ,, timeout performed, Correct Patient, Correct Site, Correct Laterality, Correct Procedure, Correct Position, site marked, Risks and benefits discussed,  Surgical consent,  Pre-op evaluation,  At surgeon's request and post-op pain management  Laterality: Left  Prep: chloraprep       Needles:  Injection technique: Single-shot  Needle Type: Echogenic Needle     Needle Length: 9cm      Additional Needles:   Procedures:,,,, ultrasound used (permanent image in chart),,,,  Narrative:  Start time: 08/16/2018 9:38 AM End time: 08/16/2018 9:45 AM Injection made incrementally with aspirations every 5 mL.  Performed by: Personally  Anesthesiologist: Eilene Ghaziose, Tameisha Covell, MD  Additional Notes: Patient tolerated the procedure well without complications

## 2018-08-16 NOTE — Anesthesia Procedure Notes (Signed)
Anesthesia Procedure Image    

## 2018-08-16 NOTE — Anesthesia Preprocedure Evaluation (Signed)
Anesthesia Evaluation  Patient identified by MRN, date of birth, ID band Patient awake    Reviewed: Allergy & Precautions, NPO status , Patient's Chart, lab work & pertinent test results  Airway Mallampati: II  TM Distance: >3 FB Neck ROM: Full    Dental no notable dental hx.    Pulmonary neg pulmonary ROS,    Pulmonary exam normal breath sounds clear to auscultation       Cardiovascular hypertension, Normal cardiovascular exam Rhythm:Regular Rate:Normal     Neuro/Psych negative neurological ROS  negative psych ROS   GI/Hepatic negative GI ROS, Neg liver ROS,   Endo/Other  negative endocrine ROS  Renal/GU negative Renal ROS  negative genitourinary   Musculoskeletal negative musculoskeletal ROS (+)   Abdominal   Peds negative pediatric ROS (+)  Hematology negative hematology ROS (+)   Anesthesia Other Findings   Reproductive/Obstetrics negative OB ROS                             Anesthesia Physical Anesthesia Plan  ASA: II and emergent  Anesthesia Plan: General   Post-op Pain Management:  Regional for Post-op pain   Induction: Intravenous  PONV Risk Score and Plan: 3 and Ondansetron, Midazolam, Dexamethasone and Treatment may vary due to age or medical condition  Airway Management Planned: LMA  Additional Equipment:   Intra-op Plan:   Post-operative Plan:   Informed Consent: I have reviewed the patients History and Physical, chart, labs and discussed the procedure including the risks, benefits and alternatives for the proposed anesthesia with the patient or authorized representative who has indicated his/her understanding and acceptance.   Dental advisory given  Plan Discussed with: CRNA and Surgeon  Anesthesia Plan Comments:         Anesthesia Quick Evaluation

## 2018-08-16 NOTE — Op Note (Signed)
Preop diagnosis: Left bimalleolar ankle fracture with syndesmosis disruption.  Postop diagnosis: Same  Procedure: Open reduction internal fixation medial lateral malleolus and open reduction internal fixation to syndesmosis.  Surgeon: Annell GreeningMark Deandre Stansel, MD  Anesthesia: Popliteal block plus LMA tube placement +5 cc Marcaine local.  Implants: Synthes 10 hole one third tubular locking plate laterally.  4 oh cancellus partially threaded Synthes stainless steel lag screws 3638 mm medial malleolus.  Procedure after induction general anesthesia with LMA tube placement patient had a preoperative popliteal block for postoperative analgesia proximal thigh tourniquet was applied leg was prepped with DuraPrep.  Extremity sheets and drapes were applied timeout procedure was completed Ancef given prophylactically.  Leg was elevated wrapped in Esmarch tourniquet inflated to 350 for total of 25 minutes.  Incision was made medially first.  Fracture showed slight displacement and there was no periosteum intermixed and caught between the fracture fragments medially.  Lateral incision was made over the fibula fibula reduced held with a self-retaining clamp.  There was equipment problems so we switched to the Synthes small fragment locking set since all portions of that set was available.  With fracture reduced a 10 hole locking plate was selected and bicortical screws were used to suck the plate down directly to the fibula.  A total of 9 screws were placed in the 10 hole plate with the distalmost pin cancellus second screw from the bottom was left open for the syndesmosis screw placement.  12 and 14 mm screws were used.  Medial lateral malleolar pressure was applied and 4 cortical placement of a 50 mm screw was performed compressing the syndesmosis of the ankle in dorsiflexion.  Medial side was then addressed after checking with C arm make sure screws in appropriate position and length and K wire was drilled with the fracture  reduced and to partially-threaded 4 oh cancellus screws were placed 3638 mm compressing the medial malleolus.  Good position final spot pictures were taken irrigation with saline solution.  2-0 Vicryl subtenons tissue skin staple closure 5 cc Marcaine infiltration laterally Xeroform 4 x 4's web roll and short leg splint.  Patient tolerated procedure well transfer the care of room in stable condition.

## 2018-08-16 NOTE — Progress Notes (Signed)
Three-view x-rays left ankle reviewed satisfactory postop position good reduction of syndesmosis.

## 2018-08-16 NOTE — Interval H&P Note (Signed)
History and Physical Interval Note:  08/16/2018 9:31 AM  Tiffany Singh  has presented today for surgery, with the diagnosis of left ankle fracture  The various methods of treatment have been discussed with the patient and family. After consideration of risks, benefits and other options for treatment, the patient has consented to  Procedure(s) with comments: OPEN REDUCTION INTERNAL FIXATION (ORIF) ANKLE FRACTURE (Left) - Biomet, Carm as a surgical intervention .  The patient's history has been reviewed, patient examined, no change in status, stable for surgery.  I have reviewed the patient's chart and labs.  Questions were answered to the patient's satisfaction.     Eldred MangesMark C Yates

## 2018-08-16 NOTE — Transfer of Care (Signed)
Immediate Anesthesia Transfer of Care Note  Patient: Tiffany ShockJanet L Lust  Procedure(s) Performed: OPEN REDUCTION INTERNAL FIXATION (ORIF) ANKLE FRACTURE (Left Ankle)  Patient Location: PACU  Anesthesia Type:General  Level of Consciousness: awake, alert  and oriented  Airway & Oxygen Therapy: Patient Spontanous Breathing and Patient connected to face mask oxygen  Post-op Assessment: Report given to RN and Post -op Vital signs reviewed and stable  Post vital signs: Reviewed and stable  Last Vitals:  Vitals Value Taken Time  BP    Temp    Pulse    Resp    SpO2      Last Pain:  Vitals:   08/16/18 0845  TempSrc:   PainSc: 6       Patients Stated Pain Goal: 3 (08/16/18 0845)  Complications: No apparent anesthesia complications

## 2018-08-16 NOTE — Anesthesia Procedure Notes (Signed)
Procedure Name: LMA Insertion Date/Time: 08/16/2018 10:04 AM Performed by: Nelle DonPulliam, Gerlene Glassburn C, CRNA Pre-anesthesia Checklist: Patient identified, Emergency Drugs available, Suction available and Patient being monitored Patient Re-evaluated:Patient Re-evaluated prior to induction Oxygen Delivery Method: Circle system utilized Preoxygenation: Pre-oxygenation with 100% oxygen Induction Type: IV induction Ventilation: Mask ventilation without difficulty LMA: LMA inserted LMA Size: 4.0 Dental Injury: Teeth and Oropharynx as per pre-operative assessment

## 2018-08-16 NOTE — H&P (Signed)
Tiffany ShockJanet L Singh is an 62 y.o. female.   Chief Complaint: Left bimalleolar closed ankle fracture with syndesmosis disruption and right ankle sprain. HPI: 62 year old female was blowing leaves in her yard tripped on a step resulting in a fall with left closed bimalleolar ankle fracture and right ankle sprain.  She was seen in the emergency room where x-rays demonstrated no fracture of the right ankle she was placed in a Swede-O ankle brace on the right.  Left ankle was placed in a splint with improvement of position.  She has a comminuted fibular fracture, pronation external rotation type injury with transverse medial malleolar fracture and widening of the syndesmosis.  She was not able ambulate on her sprain ankle nonweightbearing on the left and was admitted for operative treatment.  Patient is married and her husband is been in the [place] area after death of his mother and is flying back today.  History reviewed. No pertinent past medical history.  Past Surgical History:  Procedure Laterality Date  . CESAREAN SECTION    . LAPAROSCOPIC APPENDECTOMY N/A 12/10/2015   Procedure: APPENDECTOMY LAPAROSCOPIC;  Surgeon: Harriette Bouillonhomas Cornett, MD;  [facility];  Service: General;  Laterality: N/A;    History reviewed. No pertinent family history. Social History:  reports that she has never smoked. She has never used smokeless tobacco. She reports that she drinks alcohol. She reports that she does not use drugs.  Allergies:  Allergies  Allergen Reactions  . Scallops [Shellfish Allergy] Nausea And Vomiting  . Z-Pak [Azithromycin] Hives and Itching    ER visit    Medications Prior to Admission  Medication Sig Dispense Refill  . acetaminophen (TYLENOL) 325 MG tablet Take 2 tablets (650 mg total) by mouth every 6 (six) hours as needed for mild pain (or temp > 100).    Marland Kitchen. losartan-hydrochlorothiazide (HYZAAR) 50-12.5 MG tablet Take 1 tablet by mouth daily.    Marland Kitchen. amoxicillin-clavulanate (AUGMENTIN) 875-125 MG  tablet Take 1 tablet by mouth 2 (two) times daily. 10 tablet 0  . fluticasone (FLONASE) 50 MCG/ACT nasal spray Place 1 spray into both nostrils daily as needed for allergies or rhinitis.    Marland Kitchen. loratadine (CLARITIN) 10 MG tablet Take 10 mg by mouth daily as needed for allergies.    . magnesium gluconate (MAGONATE) 500 MG tablet Take 500 mg by mouth at bedtime.    Marland Kitchen. OVER THE COUNTER MEDICATION Take 1 tablet by mouth daily as needed (for supplementation).    Marland Kitchen. oxyCODONE (OXY IR/ROXICODONE) 5 MG immediate release tablet Take 1-2 tablets (5-10 mg total) by mouth every 6 (six) hours as needed for moderate pain. 40 tablet 0  . saccharomyces boulardii (FLORASTOR) 250 MG capsule Take 1 capsule (250 mg total) by mouth 2 (two) times daily.      Results for orders placed or performed during the hospital encounter of 08/15/18 (from the past 48 hour(s))  CBC with Differential     Status: None   Collection Time: 08/15/18  8:34 PM  Result Value Ref Range   WBC 8.0 4.0 - 10.5 K/uL   RBC 4.92 3.87 - 5.11 MIL/uL   Hemoglobin 14.6 12.0 - 15.0 g/dL   HCT 13.243.7 44.036.0 - 10.246.0 %   MCV 88.8 80.0 - 100.0 fL   MCH 29.7 26.0 - 34.0 pg   MCHC 33.4 30.0 - 36.0 g/dL   RDW 72.513.7 36.611.5 - 44.015.5 %   Platelets 314 150 - 400 K/uL   nRBC 0.0 0.0 - 0.2 %  Neutrophils Relative % 76 %   Neutro Abs 6.2 1.7 - 7.7 K/uL   Lymphocytes Relative 14 %   Lymphs Abs 1.1 0.7 - 4.0 K/uL   Monocytes Relative 8 %   Monocytes Absolute 0.7 0.1 - 1.0 K/uL   Eosinophils Relative 1 %   Eosinophils Absolute 0.1 0.0 - 0.5 K/uL   Basophils Relative 0 %   Basophils Absolute 0.0 0.0 - 0.1 K/uL   Immature Granulocytes 1 %   Abs Immature Granulocytes 0.04 0.00 - 0.07 K/uL    Comment: Performed at [facility], [address] [place], [place] [address]  Basic metabolic panel     Status: Abnormal   Collection Time: 08/15/18  8:34 PM  Result Value Ref Range   Sodium 135 135 - 145 mmol/L   Potassium 3.7 3.5 - 5.1 mmol/L   Chloride 104 98 -  111 mmol/L   CO2 21 (L) 22 - 32 mmol/L   Glucose, Bld 106 (H) 70 - 99 mg/dL   BUN 17 8 - 23 mg/dL   Creatinine, Ser 7.10 0.44 - 1.00 mg/dL   Calcium 9.0 8.9 - 62.6 mg/dL   GFR calc non Af Amer >60 >60 mL/min   GFR calc Af Amer >60 >60 mL/min   Anion gap 10 5 - 15    Comment: Performed at [facility], [address] [place], [place] [address]  MRSA PCR Screening     Status: None   Collection Time: 08/16/18  6:00 AM  Result Value Ref Range   MRSA by PCR NEGATIVE NEGATIVE    Comment:        The GeneXpert MRSA Assay (FDA approved for NASAL specimens only), is one component of a comprehensive MRSA colonization surveillance program. It is not intended to diagnose MRSA infection nor to guide or monitor treatment for MRSA infections. Performed at [facility], [address] [place], [place] [address]    Dg Ankle Complete Left  Result Date: 08/15/2018 CLINICAL DATA:  Fall, ankle injury EXAM: LEFT ANKLE COMPLETE - 3+ VIEW COMPARISON:  None. FINDINGS: Mildly comminuted oblique distal fibular shaft fracture. Minimally displaced transverse medial malleolar fracture. The ankle mortise is intact. The base of the fifth metatarsal is unremarkable. Mild lateral soft tissue swelling. IMPRESSION: Mildly comminuted oblique distal fibular shaft fracture. Minimally displaced transverse medial malleolar fracture. Electronically Signed   By: Charline Bills M.D.   On: 08/15/2018 16:27   Dg Ankle Complete Right  Result Date: 08/15/2018 CLINICAL DATA:  Larey Seat today while blowing lesions and injured both ankles. Pain and swelling. EXAM: RIGHT ANKLE - COMPLETE 3+ VIEW COMPARISON:  None. FINDINGS: There is no evidence of fracture, dislocation, or joint effusion. There is no evidence of arthropathy or other focal bone abnormality. Soft tissues are unremarkable. IMPRESSION: Negative. Electronically Signed   By: Kennith Center M.D.   On: 08/15/2018 16:27    Review of Systems   Constitutional: Negative.   HENT: Negative.   Eyes: Negative.   Respiratory: Negative.   Cardiovascular: Negative.   Gastrointestinal:       Previous laparoscopic appendectomy without problems by Dr. Luisa Hart.  Genitourinary:       2 children previous C-section for second without complications.  Skin: Negative.   Neurological: Negative.   Psychiatric/Behavioral: Negative for depression, substance abuse and suicidal ideas.    Blood pressure 124/61, pulse 76, temperature 98.8 F (37.1 C), temperature source Oral, resp. rate 16, height 5\' 3"  (1.6  m), weight 70.3 kg, SpO2 96 %. Physical Exam  Constitutional: She is oriented to person, place, and time. She appears well-developed and well-nourished.  Eyes: Pupils are equal, round, and reactive to light. Conjunctivae are normal.  Neck: Normal range of motion. Neck supple. No tracheal deviation present. No thyromegaly present.  Cardiovascular: Normal rate.  Respiratory: Effort normal. She has no wheezes. She has no rales.  GI: Soft. Bowel sounds are normal.  Musculoskeletal:  Left short leg splint.  Neurological: She is alert and oriented to person, place, and time.  Skin: Skin is warm.  Right lateral ankle ecchymosis currently in Swede-O.     Assessment/Plan Patient is a closed left bimalleolar ankle fracture pronation external rotation type injury with widening of the syndesmosis.  She will require stabilization of the fibula with plate, syndesmotic screw fixation and fixation of the medial malleolus.  She will be nonweightbearing and will work on physical therapy transfers with her opposite sprained right ankle bearing the weight.  Procedure discussed with patient we discussed syndesmotic screw removal at 10 weeks postop.  Risk surgery discussed questions elicited and answered she understands request we proceed.  Eldred Manges, MD 08/16/2018, 9:26 AM

## 2018-08-16 NOTE — Anesthesia Procedure Notes (Signed)
Anesthesia Regional Block: Femoral nerve block (saphenous)   Pre-Anesthetic Checklist: ,, timeout performed, Correct Patient, Correct Site, Correct Laterality, Correct Procedure, Correct Position, site marked, Risks and benefits discussed,  Surgical consent,  Pre-op evaluation,  At surgeon's request and post-op pain management  Laterality: Left  Prep: chloraprep       Needles:  Injection technique: Single-shot  Needle Type: Echogenic Needle     Needle Length: 5cm      Additional Needles:   Procedures:,,,, ultrasound used (permanent image in chart),,,,  Narrative:  Start time: 08/16/2018 9:46 AM End time: 08/16/2018 9:51 AM Injection made incrementally with aspirations every 5 mL.  Performed by: Personally  Anesthesiologist: Eilene Ghaziose, Virgel Haro, MD  Additional Notes: Patient tolerated the procedure well without complications

## 2018-08-17 MED ORDER — HYDROCODONE-ACETAMINOPHEN 7.5-325 MG PO TABS: 1.0000 | ORAL_TABLET | Freq: Four times a day (QID) | ORAL | 0 refills | Status: AC | PRN

## 2018-08-17 MED ORDER — ASPIRIN EC 325 MG PO TBEC: 325.0000 mg | DELAYED_RELEASE_TABLET | Freq: Every day | ORAL | 0 refills | Status: AC

## 2018-08-17 NOTE — Plan of Care (Signed)

## 2018-08-17 NOTE — Progress Notes (Signed)
Physical Therapy Treatment Patient Details Name: Tiffany ShockJanet L Singh MRN: [id number] DOB: 08-18-1956 Today's Date: 08/17/2018    History of Present Illness 62 y.o. female admitted with L distal tib/fib fx s/p ORIF and R ankle sprain 2* a fall.    PT Comments    Instructed pt in use of knee scooter, pt did well, no loss of balance. Instructed pt in donning R ankle brace.  Will plan to do stair training tomorrow morning. Pt reports she's been doing HEP independently.   Follow Up Recommendations  Follow surgeon's recommendation for DC plan and follow-up therapies     Equipment Recommendations  3in1 (PT)    Recommendations for Other Services       Precautions / Restrictions Precautions Precautions: Fall Restrictions Weight Bearing Restrictions: Yes LLE Weight Bearing: Non weight bearing Other Position/Activity Restrictions: WBAT RLE    Mobility  Bed Mobility Overal bed mobility: Modified Independent             General bed mobility comments: up in recliner  Transfers Overall transfer level: Needs assistance Equipment used: (knee scooter) Transfers: Sit to/from Stand Sit to Stand: Min guard         General transfer comment: VCs hand placement  Ambulation/Gait Ambulation/Gait assistance: Min guard Gait Distance (Feet): 180 Feet Assistive device: (knee scooter) Gait Pattern/deviations: Step-to pattern Gait velocity: decr   General Gait Details: steady with knee scooter, able to negotiate turns and stops well   Stairs             Wheelchair Mobility    Modified Rankin (Stroke Patients Only)       Balance Overall balance assessment: Needs assistance   Sitting balance-Leahy Scale: Good     Standing balance support: Bilateral upper extremity supported Standing balance-Leahy Scale: Fair                              Cognition Arousal/Alertness: Awake/alert Behavior During Therapy: WFL for tasks assessed/performed Overall Cognitive  Status: Within Functional Limits for tasks assessed                                           General Comments        Pertinent Vitals/Pain Pain Assessment: 0-10 Pain Score: 3  Pain Location: L ankle Pain Descriptors / Indicators: Sore Pain Intervention(s): Limited activity within patient's tolerance;Premedicated before session;Ice applied    Home Living Family/patient expects to be discharged to:: Private residence Living Arrangements: Spouse/significant other Available Help at Discharge: Family;Friend(s);Available 24 hours/day Type of Home: House Home Access: Stairs to enter Entrance Stairs-Rails: None Home Layout: Two level;Able to live on main level with bedroom/bathroom;1/2 bath on main level Home Equipment: Walker - 2 wheels;Toilet riser;Other (comment);Wheelchair - Scientist, water qualitymanual;Crutches(knee scooter)      Prior Function Level of Independence: Independent          PT Goals (current goals can now be found in the care plan section) Acute Rehab PT Goals Patient Stated Goal: to be able to swim, play with 64 year old grandaughter PT Goal Formulation: With patient/family Time For Goal Achievement: 08/24/18 Potential to Achieve Goals: Good Progress towards PT goals: Progressing toward goals    Frequency    Min 5X/week      PT Plan Current plan remains appropriate    Co-evaluation  AM-PAC PT "6 Clicks" Mobility   Outcome Measure  Help needed turning from your back to your side while in a flat bed without using bedrails?: A Little Help needed moving from lying on your back to sitting on the side of a flat bed without using bedrails?: A Little Help needed moving to and from a bed to a chair (including a wheelchair)?: A Little Help needed standing up from a chair using your arms (e.g., wheelchair or bedside chair)?: A Little Help needed to walk in hospital room?: A Little Help needed climbing 3-5 steps with a railing? : A Lot 6 Click  Score: 17    End of Session Equipment Utilized During Treatment: Gait belt Activity Tolerance: Patient tolerated treatment well Patient left: in chair;with call bell/phone within reach;with family/visitor present Nurse Communication: Mobility status PT Visit Diagnosis: Unsteadiness on feet (R26.81);Difficulty in walking, not elsewhere classified (R26.2);Pain Pain - Right/Left: Left Pain - part of body: Ankle and joints of foot     Time: 1323-1350 PT Time Calculation (min) (ACUTE ONLY): 27 min  Charges:  $Gait Training: 8-22 mins $Therapeutic Exercise: 8-22 mins $Therapeutic Activity: 8-22 mins                     Ralene Bathe Kistler PT 08/17/2018  Acute Rehabilitation Services Pager [phone] Office [phone]

## 2018-08-17 NOTE — Evaluation (Signed)
Physical Therapy Evaluation Patient Details Name: Tiffany Singh MRN: [id number] DOB: 1955-09-12 Today's Date: 08/17/2018   History of Present Illness  62 y.o. female admitted with L tib/fib fx s/p ORIF and R ankle sprain 2* a fall.  Clinical Impression  Pt admitted with above diagnosis. Pt currently with functional limitations due to the deficits listed below (see PT Problem List). Pt ambulated 10' with RW with min/guard assist, good adherence to NWB status LLE. Distance limited by strain to pectoral muscles with use of RW (pt felt a small pop near sternum). Instructed pt in LE strengthening exercises. Will plan trial of knee scooter next session.  Pt will benefit from skilled PT to increase their independence and safety with mobility to allow discharge to the venue listed below.       Follow Up Recommendations Follow surgeon's recommendation for DC plan and follow-up therapies    Equipment Recommendations  3in1 (PT)    Recommendations for Other Services       Precautions / Restrictions Precautions Precautions: Fall Restrictions Weight Bearing Restrictions: Yes LLE Weight Bearing: Non weight bearing Other Position/Activity Restrictions: WBAT RLE      Mobility  Bed Mobility Overal bed mobility: Modified Independent             General bed mobility comments: HOB up 30*  Transfers Overall transfer level: Needs assistance Equipment used: Rolling walker (2 wheeled) Transfers: Sit to/from Stand Sit to Stand: Min guard;From elevated surface         General transfer comment: VCs hand placement  Ambulation/Gait Ambulation/Gait assistance: Min guard Gait Distance (Feet): 10 Feet Assistive device: Rolling walker (2 wheeled) Gait Pattern/deviations: Step-to pattern Gait velocity: decr   General Gait Details: good adherence to NWB LLE, distance limited by pectoral muscle strain with use of RW (pt felt a small pop at sternal area)  Stairs            Wheelchair  Mobility    Modified Rankin (Stroke Patients Only)       Balance Overall balance assessment: Needs assistance   Sitting balance-Leahy Scale: Good     Standing balance support: Bilateral upper extremity supported Standing balance-Leahy Scale: Fair                               Pertinent Vitals/Pain Pain Assessment: 0-10 Pain Score: 4  Pain Location: L ankle Pain Descriptors / Indicators: Sore Pain Intervention(s): Limited activity within patient's tolerance;Monitored during session;Premedicated before session;Repositioned;Ice applied    Home Living Family/patient expects to be discharged to:: Private residence Living Arrangements: Spouse/significant other Available Help at Discharge: Family;Friend(s);Available 24 hours/day Type of Home: House Home Access: Stairs to enter Entrance Stairs-Rails: None Entrance Stairs-Number of Steps: 2  Home Layout: Two level;Able to live on main level with bedroom/bathroom;1/2 bath on main level Home Equipment: Walker - 2 wheels;Toilet riser;Other (comment);Wheelchair - Scientist, water quality)      Prior Function Level of Independence: Independent               Higher education careers adviser        Extremity/Trunk Assessment   Upper Extremity Assessment Upper Extremity Assessment: Overall WFL for tasks assessed    Lower Extremity Assessment Lower Extremity Assessment: RLE deficits/detail;LLE deficits/detail RLE Deficits / Details: R ankle AROM and strength WNL, mild pain with end range inversion AROM LLE Deficits / Details: L ankle in soft cast so NT, can wiggle toes, SLR at least 3/5, knee ext  at least 3/5 LLE: Unable to fully assess due to immobilization LLE Sensation: WNL    Cervical / Trunk Assessment Cervical / Trunk Assessment: Normal  Communication   Communication: No difficulties  Cognition Arousal/Alertness: Awake/alert Behavior During Therapy: WFL for tasks assessed/performed Overall Cognitive Status:  Within Functional Limits for tasks assessed                                        General Comments      Exercises General Exercises - Lower Extremity Ankle Circles/Pumps: AROM;Right;10 reps;Supine Quad Sets: AROM;Both;5 reps;Supine Long Arc Quad: Left;AROM;5 reps;Supine Heel Slides: AROM;Left;5 reps;Supine Straight Leg Raises: Left;5 reps;AROM;Supine   Assessment/Plan    PT Assessment Patient needs continued PT services  PT Problem List Decreased activity tolerance;Decreased balance;Decreased mobility;Decreased knowledge of use of DME;Pain       PT Treatment Interventions DME instruction;Gait training;Stair training;Functional mobility training;Therapeutic exercise;Balance training;Patient/family education    PT Goals (Current goals can be found in the Care Plan section)  Acute Rehab PT Goals Patient Stated Goal: to be able to swim PT Goal Formulation: With patient/family Time For Goal Achievement: 08/24/18 Potential to Achieve Goals: Good    Frequency Min 5X/week   Barriers to discharge        Co-evaluation               AM-PAC PT "6 Clicks" Mobility  Outcome Measure Help needed turning from your back to your side while in a flat bed without using bedrails?: A Little Help needed moving from lying on your back to sitting on the side of a flat bed without using bedrails?: A Little Help needed moving to and from a bed to a chair (including a wheelchair)?: A Little Help needed standing up from a chair using your arms (e.g., wheelchair or bedside chair)?: A Little Help needed to walk in hospital room?: A Little Help needed climbing 3-5 steps with a railing? : A Lot 6 Click Score: 17    End of Session Equipment Utilized During Treatment: Gait belt Activity Tolerance: Patient limited by pain;Patient tolerated treatment well Patient left: in chair;with call bell/phone within reach;with family/visitor present Nurse Communication: Mobility status PT  Visit Diagnosis: Unsteadiness on feet (R26.81);Difficulty in walking, not elsewhere classified (R26.2);Pain Pain - Right/Left: Left Pain - part of body: Ankle and joints of foot    Time: 1005-1100 PT Time Calculation (min) (ACUTE ONLY): 55 min   Charges:   PT Evaluation $PT Eval Low Complexity: 1 Low PT Treatments $Gait Training: 8-22 mins $Therapeutic Exercise: 8-22 mins $Therapeutic Activity: 8-22 mins        Ralene BatheUhlenberg, Mekaela Azizi Kistler PT 08/17/2018  Acute Rehabilitation Services Pager [phone] Office [phone]

## 2018-08-18 NOTE — Progress Notes (Signed)
   Subjective: 2 Days Post-Op Procedure(s) (LRB): OPEN REDUCTION INTERNAL FIXATION (ORIF) ANKLE FRACTURE (Left) Patient reports pain as mild.    Objective: Vital signs in last 24 hours: Temp:  [97.8 F (36.6 C)-98.5 F (36.9 C)] 98.5 F (36.9 C) (12/10 0440) Pulse Rate:  [55-71] 61 (12/10 0440) Resp:  [14-17] 17 (12/10 0440) BP: (91-115)/(61-82) 109/79 (12/10 0440) SpO2:  [94 %-100 %] 97 % (12/10 0440)  Intake/Output from previous day: 12/09 0701 - 12/10 0700 In: 900 [P.O.:900] Out: 400 [Urine:400] Intake/Output this shift: Total I/O In: 240 [P.O.:240] Out: -   Recent Labs    08/15/18 2034 08/16/18 1453  HGB 14.6 13.4   Recent Labs    08/15/18 2034 08/16/18 1453  WBC 8.0 7.4  RBC 4.92 4.48  HCT 43.7 40.5  PLT 314 258   Recent Labs    08/15/18 2034 08/16/18 1453  NA 135  --   K 3.7  --   CL 104  --   CO2 21*  --   BUN 17  --   CREATININE 0.69 0.47  GLUCOSE 106*  --   CALCIUM 9.0  --    No results for input(s): LABPT, INR in the last 72 hours.  Neurologically intact No results found.  Assessment/Plan: 2 Days Post-Op Procedure(s) (LRB): OPEN REDUCTION INTERNAL FIXATION (ORIF) ANKLE FRACTURE (Left) Plan: discharge home. Office days . rx for pain and ASA one po daily times one month. Ready for discharge, equipment at home. Husband at bedside.   Eldred MangesMark C Yates 08/18/2018, 11:03 AM

## 2018-08-18 NOTE — Discharge Instructions (Signed)
Keep splint dry. Elevated foot to decrease pain. Ice on and off for a few days also helps the pain. See Dr.  Ophelia CharterYates one week from Friday.

## 2018-08-18 NOTE — Care Management Note (Signed)
Case Management Note  Patient Details  Name: Tiffany Singh MRN: [id number] Date of Birth: 1955-12-06  Subjective/Objective:   Spoke with patient at bedside. Confirmed plan for HEP. Has RW but needs a 3n1. [phone]                 Action/Plan: Contacted [facility] to deliver 3n1 to the room.  Expected Discharge Date:  08/18/18               Expected Discharge Plan:  Home/Self Care  In-House Referral:  NA  Discharge planning Services  NA  Post Acute Care Choice:  NA Choice offered to:  Patient  DME Arranged:  3-N-1 DME Agency:  [facility]  HH Arranged:  NA HH Agency:  NA  Status of Service:  Completed, signed off  If discussed at Long Length of Stay Meetings, dates discussed:    Additional Comments:  Alexis Goodelleele, Fumi Guadron K, RN 08/18/2018, 11:32 AM

## 2018-08-18 NOTE — Progress Notes (Signed)
Physical Therapy Treatment Patient Details Name: Tiffany Singh MRN: [id number] DOB: August 17, 1956 Today's Date: 08/18/2018    History of Present Illness 62 y.o. female admitted with L distal tib/fib fx s/p ORIF and R ankle sprain 2* a fall.    PT Comments    Pt is progressing well with mobility, she reports she's been able to use knee scooter to scoot in the hallway independently and that she's been performing HEP. She reports sternal pain with use of RW, so instructed pt to sit down to ascend/descend stairs, she and her husband practiced this and feel comfortable with this technique. From PT standpoint, she is ready to DC home. Pt/spouse expressed frustration that DC plan hasn't been communicated with them, they are eager to go home. RN has contacted MD's office regarding this.     Follow Up Recommendations  Follow surgeon's recommendation for DC plan and follow-up therapies     Equipment Recommendations  3in1 (PT)    Recommendations for Other Services       Precautions / Restrictions Precautions Precautions: Fall Restrictions Weight Bearing Restrictions: Yes LLE Weight Bearing: Non weight bearing Other Position/Activity Restrictions: WBAT RLE    Mobility  Bed Mobility               General bed mobility comments: up in recliner  Transfers Overall transfer level: Needs assistance Equipment used: (knee scooter) Transfers: Sit to/from Stand Sit to Stand: Supervision            Ambulation/Gait Ambulation/Gait assistance: Min guard Gait Distance (Feet): 14 Feet Assistive device: Rolling walker (2 wheeled)(knee scooter)       General Gait Details: pt ambulated short distance with RW to the stairs, RW aggravated her sternal pain, so used knee scooter for further mobility   Stairs Stairs: Yes Stairs assistance: Min assist Stair Management: No rails;Backwards Number of Stairs: 2 General stair comments: trial of RW to hop up step with RLE however this  increased sternal pain, so pt sat down on steps and bumped up, husband will assist pt to stand from sitting position for stairs at home   Wheelchair Mobility    Modified Rankin (Stroke Patients Only)       Balance Overall balance assessment: Needs assistance   Sitting balance-Leahy Scale: Good     Standing balance support: Bilateral upper extremity supported Standing balance-Leahy Scale: Fair                              Cognition Arousal/Alertness: Awake/alert Behavior During Therapy: WFL for tasks assessed/performed Overall Cognitive Status: Within Functional Limits for tasks assessed                                        Exercises      General Comments        Pertinent Vitals/Pain Pain Score: 3  Pain Location: L ankle Pain Descriptors / Indicators: Sore Pain Intervention(s): Limited activity within patient's tolerance;Monitored during session;Premedicated before session;Ice applied    Home Living                      Prior Function            PT Goals (current goals can now be found in the care plan section) Acute Rehab PT Goals Patient Stated Goal: to be able to swim, play  with 64 year old grandaughter PT Goal Formulation: With patient/family Time For Goal Achievement: 08/24/18 Potential to Achieve Goals: Good Progress towards PT goals: Progressing toward goals    Frequency    Min 5X/week      PT Plan Current plan remains appropriate    Co-evaluation              AM-PAC PT "6 Clicks" Mobility   Outcome Measure  Help needed turning from your back to your side while in a flat bed without using bedrails?: None Help needed moving from lying on your back to sitting on the side of a flat bed without using bedrails?: None Help needed moving to and from a bed to a chair (including a wheelchair)?: None Help needed standing up from a chair using your arms (e.g., wheelchair or bedside chair)?: None Help  needed to walk in hospital room?: None Help needed climbing 3-5 steps with a railing? : A Little 6 Click Score: 23    End of Session Equipment Utilized During Treatment: Gait belt Activity Tolerance: Patient tolerated treatment well Patient left: in chair;with call bell/phone within reach;with family/visitor present Nurse Communication: Mobility status PT Visit Diagnosis: Unsteadiness on feet (R26.81);Difficulty in walking, not elsewhere classified (R26.2);Pain Pain - Right/Left: Left Pain - part of body: Ankle and joints of foot     Time: 1000-1031 PT Time Calculation (min) (ACUTE ONLY): 31 min  Charges:  $Gait Training: 8-22 mins $Therapeutic Activity: 8-22 mins                    Ralene BatheUhlenberg, Yetunde Leis Kistler PT 08/18/2018  Acute Rehabilitation Services Pager [phone] Office [phone]

## 2018-08-18 NOTE — Progress Notes (Signed)
Patient ID: Tiffany Singh, female   DOB: [birth year], 62 y.o.   MRN: [id number]   Patient was seen by me yesterday evening.  Stated that she was doing well and making great progress with PT.  Husband was present in the room.  Exam Splint left lower extremity intact.  Moves toes well.  Right ankle brace on.  Right calf nontender.  Plan Advised patient will continue PT.  Dr. Ophelia CharterYates will see patient later this afternoon.  Anticipate discharge home.  I did put scripts on chart for Norco PRN for pain and also aspirin 325 mg 1 tab p.o. daily x4 weeks for DVT prophylaxis.

## 2018-08-26 NOTE — Discharge Summary (Signed)
Patient ID: Tiffany ShockJanet L Singh MRN: [id number] DOB/AGE: 1955-10-26 62 y.o.  Admit date: 08/15/2018 Discharge date: 08/26/2018  Admission Diagnoses:  Active Problems:   Tibia/fibula fracture   Closed left ankle fracture   Discharge Diagnoses:  Active Problems:   Tibia/fibula fracture   Closed left ankle fracture  status post Procedure(s): OPEN REDUCTION INTERNAL FIXATION (ORIF) ANKLE FRACTURE  History reviewed. No pertinent past medical history.  Surgeries: Procedure(s): OPEN REDUCTION INTERNAL FIXATION (ORIF) ANKLE FRACTURE on 08/16/2018   Consultants:   Discharged Condition: Improved  Hospital Course: Tiffany Singh is an 62 y.o. female who was admitted 08/15/2018 for operative treatment of ankle fracture. Patient failed conservative treatments (please see the history and physical for the specifics) and had severe unremitting pain that affects sleep, daily activities and work/hobbies. After pre-op clearance, the patient was taken to the operating room on 08/16/2018 and underwent  Procedure(s): OPEN REDUCTION INTERNAL FIXATION (ORIF) ANKLE FRACTURE.    Patient was given perioperative antibiotics:  Anti-infectives (From admission, onward)   Start     Dose/Rate Route Frequency Ordered Stop   08/17/18 0600  ceFAZolin (ANCEF) IVPB 2g/100 mL premix  Status:  Discontinued     2 g 200 mL/hr over 30 Minutes Intravenous On call to O.R. 08/16/18 1309 08/16/18 1314   08/16/18 0935  ceFAZolin (ANCEF) 2-4 GM/100ML-% IVPB    Note to Pharmacy:  Yevonne PaxPulliam, Elizabeth  : cabinet override      08/16/18 0935 08/16/18 2144       Patient was given sequential compression devices and early ambulation to prevent DVT.   Patient benefited maximally from hospital stay and there were no complications. At the time of discharge, the patient was urinating/moving their bowels without difficulty, tolerating a regular diet, pain is controlled with oral pain medications and they have been cleared by PT/OT.    Recent vital signs: No data found.   Recent laboratory studies: No results for input(s): WBC, HGB, HCT, PLT, NA, K, CL, CO2, BUN, CREATININE, GLUCOSE, INR, CALCIUM in the last 72 hours.  Invalid input(s): PT, 2   Discharge Medications:   Allergies as of 08/18/2018      Reactions   Scallops [shellfish Allergy] Nausea And Vomiting   Z-pak [azithromycin] Hives, Itching   ER visit      Medication List    STOP taking these medications   acetaminophen 325 MG tablet Commonly known as:  TYLENOL   amoxicillin-clavulanate 875-125 MG tablet Commonly known as:  AUGMENTIN   oxyCODONE 5 MG immediate release tablet Commonly known as:  Oxy IR/ROXICODONE   saccharomyces boulardii 250 MG capsule Commonly known as:  FLORASTOR     TAKE these medications   aspirin EC 325 MG tablet Take 1 tablet (325 mg total) by mouth daily.   fluticasone 50 MCG/ACT nasal spray Commonly known as:  FLONASE Place 1 spray into both nostrils daily as needed for allergies or rhinitis.   HYDROcodone-acetaminophen 7.5-325 MG tablet Commonly known as:  NORCO Take 1-2 tablets by mouth every 6 (six) hours as needed for severe pain (pain score 7-10).   loratadine 10 MG tablet Commonly known as:  CLARITIN Take 10 mg by mouth daily as needed for allergies.   losartan-hydrochlorothiazide 50-12.5 MG tablet Commonly known as:  HYZAAR Take 1 tablet by mouth daily.   magnesium gluconate 500 MG tablet Commonly known as:  MAGONATE Take 500 mg by mouth at bedtime.   OVER THE COUNTER MEDICATION Take 1 tablet by mouth daily as needed (for  supplementation).       Diagnostic Studies: Dg Ankle 2 Views Left  Result Date: 08/16/2018 CLINICAL DATA:  Open reduction and internal fixation of left ankle fractures. EXAM: LEFT ANKLE - 2 VIEW COMPARISON:  Radiographs 08/15/2018 FINDINGS: There is a long fibular compression plate and screws with anatomic reduction of the distal fibular shaft fracture. Two cannulated  malleolar screws are transfixing the medial malleolus fracture. Single long cortical screw transfixing the tibiofibular syndesmosis. Stable small posterior malleolar fracture. Very slight lateral ankle mortise widening. The talus appears normal. IMPRESSION: Open reduction and internal fixation of distal fibular and tibial fractures with anatomic reduction. Electronically Signed   By: Rudie Meyer M.D.   On: 08/16/2018 12:25   X-ray Ankle Left Ap Lateral And Oblique  Result Date: 08/16/2018 CLINICAL DATA:  Left ankle fracture fixation. EXAM: LEFT ANKLE COMPLETE - 3+ VIEW COMPARISON:  Intraoperative films, same date. FINDINGS: Postoperative changes and a plaster splint in place. Long lateral sideplate and screws transfixing the fibular fracture with anatomic reduction. Two cannulated malleolar screws are transfixing the medial malleolus fracture. Small posterior malleolar fracture is again noted. IMPRESSION: Anatomic reduction of the tibia and fibular fractures. Electronically Signed   By: Rudie Meyer M.D.   On: 08/16/2018 13:13   Dg Ankle Complete Left  Result Date: 08/15/2018 CLINICAL DATA:  Fall, ankle injury EXAM: LEFT ANKLE COMPLETE - 3+ VIEW COMPARISON:  None. FINDINGS: Mildly comminuted oblique distal fibular shaft fracture. Minimally displaced transverse medial malleolar fracture. The ankle mortise is intact. The base of the fifth metatarsal is unremarkable. Mild lateral soft tissue swelling. IMPRESSION: Mildly comminuted oblique distal fibular shaft fracture. Minimally displaced transverse medial malleolar fracture. Electronically Signed   By: Charline Bills M.D.   On: 08/15/2018 16:27   Dg Ankle Complete Right  Result Date: 08/15/2018 CLINICAL DATA:  Larey Seat today while blowing lesions and injured both ankles. Pain and swelling. EXAM: RIGHT ANKLE - COMPLETE 3+ VIEW COMPARISON:  None. FINDINGS: There is no evidence of fracture, dislocation, or joint effusion. There is no evidence of arthropathy  or other focal bone abnormality. Soft tissues are unremarkable. IMPRESSION: Negative. Electronically Signed   By: Kennith Center M.D.   On: 08/15/2018 16:27   Dg C-arm 1-60 Min-no Report  Result Date: 08/16/2018 Fluoroscopy was utilized by the requesting physician.  No radiographic interpretation.      Follow-up Information    Eldred Manges, MD. Schedule an appointment as soon as possible for a visit in 10 day(s).   Specialty:  Orthopedic Surgery Why:  need return office visit to see dr. Ophelia Charter on next Friday  Contact information: [address] [place] [address] [phone]           Discharge Plan:  discharge to home  Disposition:     Signed: Zonia Kief  08/26/2018, 12:56 PM

## 2018-08-28 ENCOUNTER — Ambulatory Visit (INDEPENDENT_AMBULATORY_CARE_PROVIDER_SITE_OTHER): Payer: 59 | Admitting: Orthopaedic Surgery

## 2018-08-28 ENCOUNTER — Ambulatory Visit (INDEPENDENT_AMBULATORY_CARE_PROVIDER_SITE_OTHER): Payer: 59

## 2018-08-28 ENCOUNTER — Encounter (INDEPENDENT_AMBULATORY_CARE_PROVIDER_SITE_OTHER): Payer: Self-pay | Admitting: Orthopaedic Surgery

## 2018-08-28 VITALS — BP 119/74 | HR 83

## 2018-08-28 DIAGNOSIS — S82402A Unspecified fracture of shaft of left fibula, initial encounter for closed fracture: Secondary | ICD-10-CM

## 2018-08-28 DIAGNOSIS — S82202A Unspecified fracture of shaft of left tibia, initial encounter for closed fracture: Secondary | ICD-10-CM

## 2018-08-28 NOTE — Progress Notes (Signed)
   Post-Op Visit Note   Patient: Tiffany Singh           Date of Birth: [birth year]           MRN: [id number] Visit Date: 08/28/2018 PCP: Soundra PilonBrake, Andrew R, FNP   Assessment & Plan: Post bimalleolar ankle fixation pronation external rotation with syndesmotic screw.  Staples removed Steri-Strips applied short leg fiberglass cast.  Nonweightbearing office follow-up 5 weeks for cast off repeat x-rays and cam boot application. ( Plan for syndesmotic screw removal at 10 weeks in the office.  Synthes small fragment screwdriver will be needed when she gets to 10 weeks after surgery.)  Chief Complaint:  Chief Complaint  Patient presents with  . Left Ankle - Routine Post Op    08/16/18 ORIF Left Ankle Fracture   Visit Diagnoses:  1. Closed fracture of left tibia and fibula, initial encounter   2. syndosmotic ligament tear with ORIF  Plan: Return 5 weeks for cast removal x-rays of ankle 3 views left and cam boot application.  Follow-Up Instructions: No follow-ups on file.   Orders:  Orders Placed This Encounter  Procedures  . XR Ankle Complete Left   No orders of the defined types were placed in this encounter.   Imaging: No results found.  PMFS History: Patient Active Problem List   Diagnosis Date Noted  . Closed left ankle fracture 08/16/2018  . Tibia/fibula fracture 08/15/2018  . Acute appendicitis 12/10/2015   No past medical history on file.  No family history on file.  Past Surgical History:  Procedure Laterality Date  . CESAREAN SECTION    . LAPAROSCOPIC APPENDECTOMY N/A 12/10/2015   Procedure: APPENDECTOMY LAPAROSCOPIC;  Surgeon: Harriette Bouillonhomas Cornett, MD;  Location: [facility] OR;  Service: General;  Laterality: N/A;   Social History   Occupational History  . Not on file  Tobacco Use  . Smoking status: Never Smoker  . Smokeless tobacco: Never Used  Substance and Sexual Activity  . Alcohol use: Yes    Comment: wine weekly  . Drug use: No  . Sexual activity: Not on file

## 2018-08-31 ENCOUNTER — Encounter (HOSPITAL_COMMUNITY): Payer: Self-pay | Admitting: Orthopaedic Surgery

## 2018-09-09 ENCOUNTER — Other Ambulatory Visit (INDEPENDENT_AMBULATORY_CARE_PROVIDER_SITE_OTHER): Payer: Self-pay | Admitting: Surgery

## 2018-09-10 NOTE — Telephone Encounter (Signed)
Does not need this any more

## 2018-09-10 NOTE — Telephone Encounter (Signed)
Can you advise? 

## 2018-10-02 ENCOUNTER — Ambulatory Visit (INDEPENDENT_AMBULATORY_CARE_PROVIDER_SITE_OTHER): Payer: 59

## 2018-10-02 ENCOUNTER — Ambulatory Visit (INDEPENDENT_AMBULATORY_CARE_PROVIDER_SITE_OTHER): Payer: 59 | Admitting: Orthopaedic Surgery

## 2018-10-02 VITALS — BP 129/83 | Ht 63.0 in | Wt 150.0 lb

## 2018-10-02 DIAGNOSIS — S82402A Unspecified fracture of shaft of left fibula, initial encounter for closed fracture: Secondary | ICD-10-CM

## 2018-10-02 DIAGNOSIS — S82202A Unspecified fracture of shaft of left tibia, initial encounter for closed fracture: Secondary | ICD-10-CM | POA: Diagnosis not present

## 2018-10-02 NOTE — Progress Notes (Signed)
Post-ORIF left ankle with syndesmotic screw.  X-rays demonstrate healing of the medial malleolus fracture and fibula.  We will allow her to begin partial weightbearing and progress as tolerated.  She can remove her cam boot to wash her leg apply soap water and lotion.  I plan to check her back again and 5 weeks and we can repeat x-rays and discuss scheduling syndesmotic screw removal in the office.

## 2018-11-06 ENCOUNTER — Ambulatory Visit (INDEPENDENT_AMBULATORY_CARE_PROVIDER_SITE_OTHER): Payer: Self-pay | Admitting: Orthopaedic Surgery

## 2018-11-06 ENCOUNTER — Encounter (INDEPENDENT_AMBULATORY_CARE_PROVIDER_SITE_OTHER): Payer: Self-pay | Admitting: Orthopaedic Surgery

## 2018-11-06 ENCOUNTER — Ambulatory Visit (INDEPENDENT_AMBULATORY_CARE_PROVIDER_SITE_OTHER): Payer: Self-pay

## 2018-11-06 VITALS — BP 135/86 | HR 81 | Ht 63.0 in | Wt 150.0 lb

## 2018-11-06 DIAGNOSIS — S82402A Unspecified fracture of shaft of left fibula, initial encounter for closed fracture: Secondary | ICD-10-CM

## 2018-11-06 DIAGNOSIS — S82202A Unspecified fracture of shaft of left tibia, initial encounter for closed fracture: Secondary | ICD-10-CM

## 2018-11-06 NOTE — Progress Notes (Signed)
   Post-Op Visit Note   Patient: Tiffany Singh           Date of Birth: 1956-07-18           MRN: [id number] Visit Date: 11/06/2018 PCP: Soundra Pilon, FNP   Assessment & Plan: Return in 2 weeks for planned syndesmotic removal.  Chief Complaint:  Chief Complaint  Patient presents with  . Left Ankle - Follow-up    08/16/18 ORIF Left Ankle Fx   Visit Diagnoses:  1. Closed fracture of left tibia and fibula, initial encounter     Plan: We will set up for syndesmotic screw removal with x-rays AP and lateral after Dr. Ophelia Charter applies the BB for localization.  We discussed syndesmotic removal she can weight-bear as tolerated using her cane in the meantime.  Follow-Up Instructions: No follow-ups on file.   Orders:  Orders Placed This Encounter  Procedures  . XR Ankle Complete Left   No orders of the defined types were placed in this encounter.   Imaging: No results found.  PMFS History: Patient Active Problem List   Diagnosis Date Noted  . Closed left ankle fracture 08/16/2018  . Tibia/fibula fracture 08/15/2018  . Acute appendicitis 12/10/2015   No past medical history on file.  No family history on file.  Past Surgical History:  Procedure Laterality Date  . CESAREAN SECTION    . LAPAROSCOPIC APPENDECTOMY N/A 12/10/2015   Procedure: APPENDECTOMY LAPAROSCOPIC;  Surgeon: Harriette Bouillon, MD;  Location: [facility] OR;  Service: General;  Laterality: N/A;  . ORIF ANKLE FRACTURE Left 08/16/2018   Procedure: OPEN REDUCTION INTERNAL FIXATION (ORIF) ANKLE FRACTURE;  Surgeon: Eldred Manges, MD;  Location: [facility] ORS;  Service: Orthopedics;  Laterality: Left;  Popliteal Block   Social History   Occupational History  . Not on file  Tobacco Use  . Smoking status: Never Smoker  . Smokeless tobacco: Never Used  Substance and Sexual Activity  . Alcohol use: Yes    Comment: wine weekly  . Drug use: No  . Sexual activity: Not on file

## 2018-11-20 ENCOUNTER — Ambulatory Visit (INDEPENDENT_AMBULATORY_CARE_PROVIDER_SITE_OTHER): Payer: Medicare Other

## 2018-11-20 ENCOUNTER — Ambulatory Visit (INDEPENDENT_AMBULATORY_CARE_PROVIDER_SITE_OTHER): Payer: Medicare Other | Admitting: Orthopaedic Surgery

## 2018-11-20 ENCOUNTER — Encounter (INDEPENDENT_AMBULATORY_CARE_PROVIDER_SITE_OTHER): Payer: Self-pay | Admitting: Orthopaedic Surgery

## 2018-11-20 ENCOUNTER — Other Ambulatory Visit: Payer: Self-pay

## 2018-11-20 VITALS — BP 127/83 | HR 94 | Ht 63.0 in | Wt 150.0 lb

## 2018-11-20 DIAGNOSIS — S82402A Unspecified fracture of shaft of left fibula, initial encounter for closed fracture: Secondary | ICD-10-CM | POA: Diagnosis not present

## 2018-11-20 DIAGNOSIS — S82202A Unspecified fracture of shaft of left tibia, initial encounter for closed fracture: Secondary | ICD-10-CM

## 2018-11-20 NOTE — Progress Notes (Signed)
   Office Visit Note   Patient: Tiffany Singh           Date of Birth: 04/16/1956           MRN: [id number] Visit Date: 11/20/2018              Requested by: Soundra Pilon, FNP [address] [place], [place] [address] PCP: Soundra Pilon, FNP   Assessment & Plan: Visit Diagnoses:  1. Closed fracture of left tibia and fibula, initial encounter     Plan: Patient returns for syndesmotic screw removal.  Betadine prep sterile technique tiny BB localization with x-rays and cold spray sterile drapes stab incision was made directly over the screw and screw was removed closed with a single 4-0 nylon suture.  Patient tolerated procedure well return 1 week for suture removal.  Follow-Up Instructions: Return in about 1 week (around 11/27/2018).   Orders:  Orders Placed This Encounter  Procedures  . XR Ankle 2 Views Left   No orders of the defined types were placed in this encounter.     Procedures: No procedures performed   Clinical Data: No additional findings.   Subjective: Chief Complaint  Patient presents with  . syndesmotic screw removal    left ankle, 08/16/18 ORIF Left ankle fracture    HPI follow-up ankle fracture syndesmotic screw removal today.  Skin looks good. Review of Systems unchanged   Objective: Vital Signs: BP 127/83   Pulse 94   Ht 5\' 3"  (1.6 m)   Wt 150 lb (68 kg)   BMI 26.57 kg/m   Physical Exam no change in general physical exam.  Ortho Exam lateral ankle is well healed.  Lateral fibular plate screw heads are palpable underneath the skin.  Specialty Comments:  No specialty comments available.  Imaging: Xr Ankle 2 Views Left  Result Date: 11/20/2018 X-ray of the ankle shows a BB is immediately adjacent to the screw slightly anterior and few millimeters proximal.  Ankle fracture is healed and syndesmosis is reduced. Impression: 2 view x-rays ankle taken for localization of syndesmotic screw as described above.    PMFS History: Patient  Active Problem List   Diagnosis Date Noted  . Closed left ankle fracture 08/16/2018  . Tibia/fibula fracture 08/15/2018  . Acute appendicitis 12/10/2015   No past medical history on file.  No family history on file.  Past Surgical History:  Procedure Laterality Date  . CESAREAN SECTION    . LAPAROSCOPIC APPENDECTOMY N/A 12/10/2015   Procedure: APPENDECTOMY LAPAROSCOPIC;  Surgeon: Harriette Bouillon, MD;  Location: [facility] OR;  Service: General;  Laterality: N/A;  . ORIF ANKLE FRACTURE Left 08/16/2018   Procedure: OPEN REDUCTION INTERNAL FIXATION (ORIF) ANKLE FRACTURE;  Surgeon: Eldred Manges, MD;  Location: [facility] ORS;  Service: Orthopedics;  Laterality: Left;  Popliteal Block   Social History   Occupational History  . Not on file  Tobacco Use  . Smoking status: Never Smoker  . Smokeless tobacco: Never Used  Substance and Sexual Activity  . Alcohol use: Yes    Comment: wine weekly  . Drug use: No  . Sexual activity: Not on file

## 2018-11-24 ENCOUNTER — Ambulatory Visit (INDEPENDENT_AMBULATORY_CARE_PROVIDER_SITE_OTHER): Payer: Self-pay | Admitting: Orthopaedic Surgery

## 2018-11-27 ENCOUNTER — Ambulatory Visit (INDEPENDENT_AMBULATORY_CARE_PROVIDER_SITE_OTHER): Payer: Medicare Other | Admitting: Orthopaedic Surgery

## 2018-11-27 ENCOUNTER — Other Ambulatory Visit: Payer: Self-pay

## 2018-11-27 DIAGNOSIS — S82892S Other fracture of left lower leg, sequela: Secondary | ICD-10-CM

## 2018-11-27 DIAGNOSIS — W19XXXD Unspecified fall, subsequent encounter: Secondary | ICD-10-CM

## 2018-11-27 NOTE — Progress Notes (Signed)
Follow-up syndesmotic screw removal left ankle.  She is walking with tennis shoes no cane.  Ankle motion is significantly improved we discussed standing on a step stretching her heel cord doing some strengthening exercises to help with gait.  Single suture is removed from syndesmotic screw removal in office follow-up PRN.

## 2019-09-30 ENCOUNTER — Ambulatory Visit: Payer: Medicare Other | Attending: Internal Medicine

## 2019-09-30 DIAGNOSIS — Z20822 Contact with and (suspected) exposure to covid-19: Secondary | ICD-10-CM

## 2019-10-01 LAB — NOVEL CORONAVIRUS, NAA: SARS-CoV-2, NAA: DETECTED — AB

## 2019-10-02 ENCOUNTER — Telehealth: Payer: Self-pay | Admitting: Physician Assistant

## 2019-10-02 NOTE — Telephone Encounter (Signed)
Called to discuss with Lewis Shock about Covid symptoms and the use of bamlanivimab, a monoclonal antibody infusion for those with mild to moderate Covid symptoms and at a high risk of hospitalization.     Pt is qualified for this infusion at the [facility] infusion center due to co-morbid conditions and/or a member of an at-risk group, however declines infusion at this time. Symptoms tier reviewed as well as criteria for ending isolation.  Symptoms reviewed that would warrant ED/Hospital evaluation. Preventative practices reviewed. Patient verbalized understanding. Patient advised to call back if he decides that he does want to get infusion. Callback number to the infusion center given. Patient advised to go to Urgent care or ED with severe symptoms. Last date she would be eligible for infusion is 10/05/19.   Manson Passey, [place]

## 2019-11-30 IMAGING — DX DG ANKLE COMPLETE 3+V*R*
3 series · 3 of 3 positions shown · non-contrast
Comparison: None.

CLINICAL DATA: Fell today while blowing lesions and injured both
ankles. Pain and swelling.

EXAM:
RIGHT ANKLE - COMPLETE 3+ VIEW

[ankle ap]
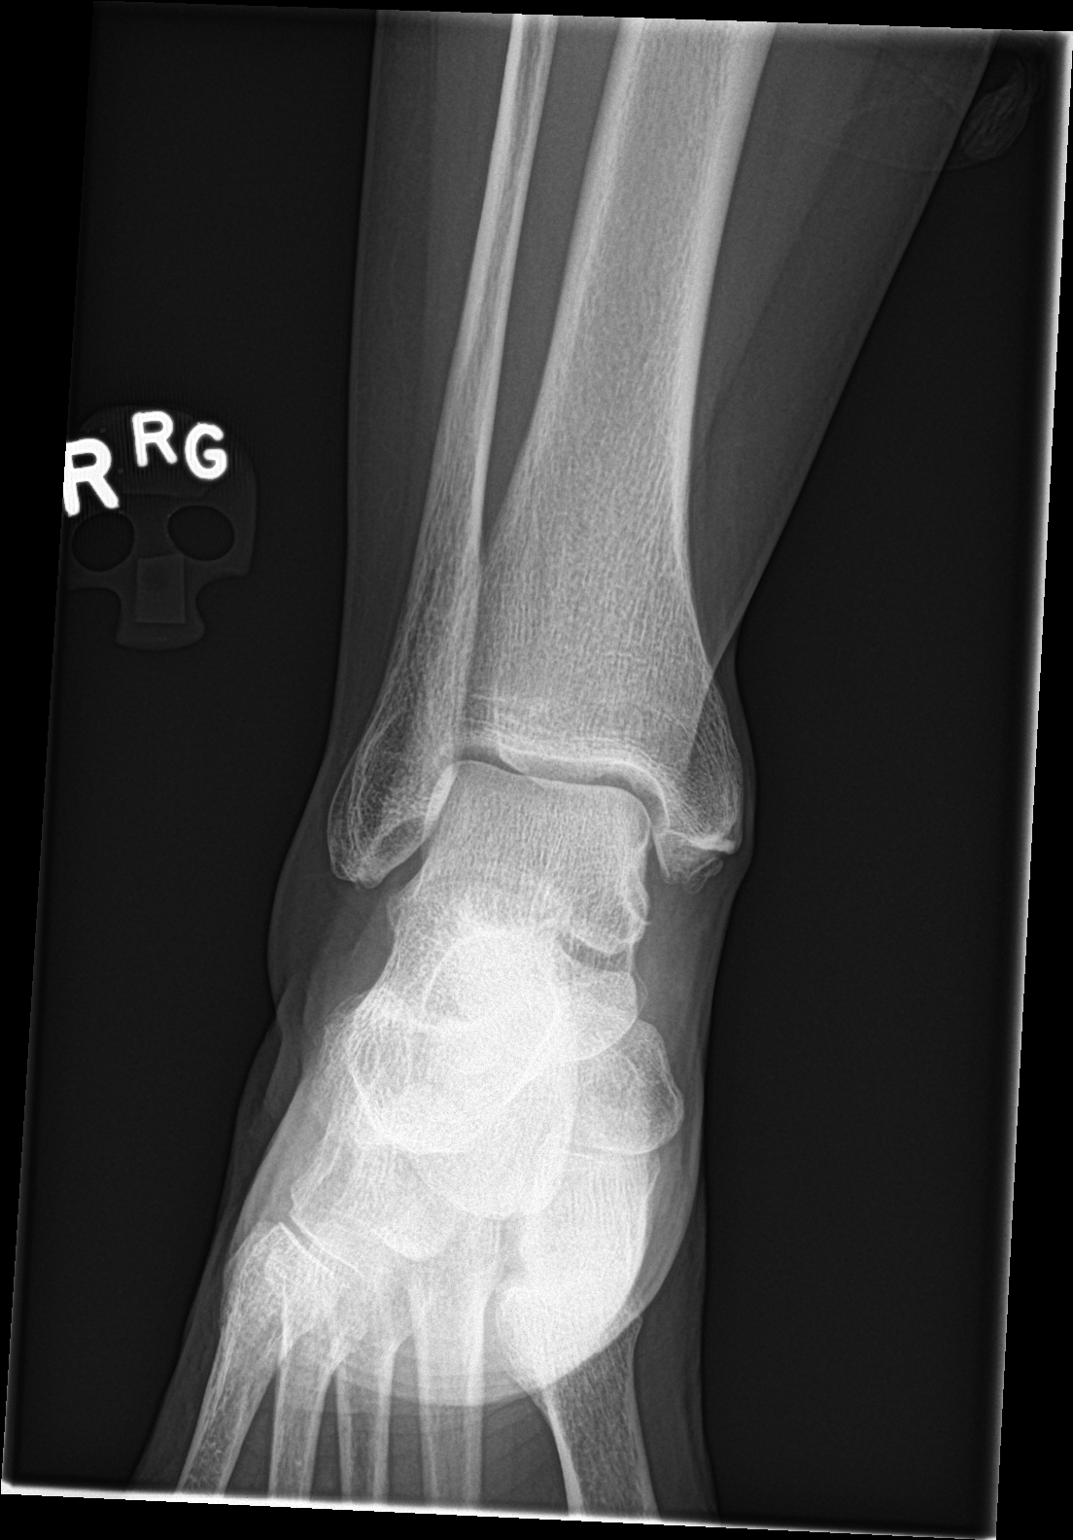

[ankle obl]
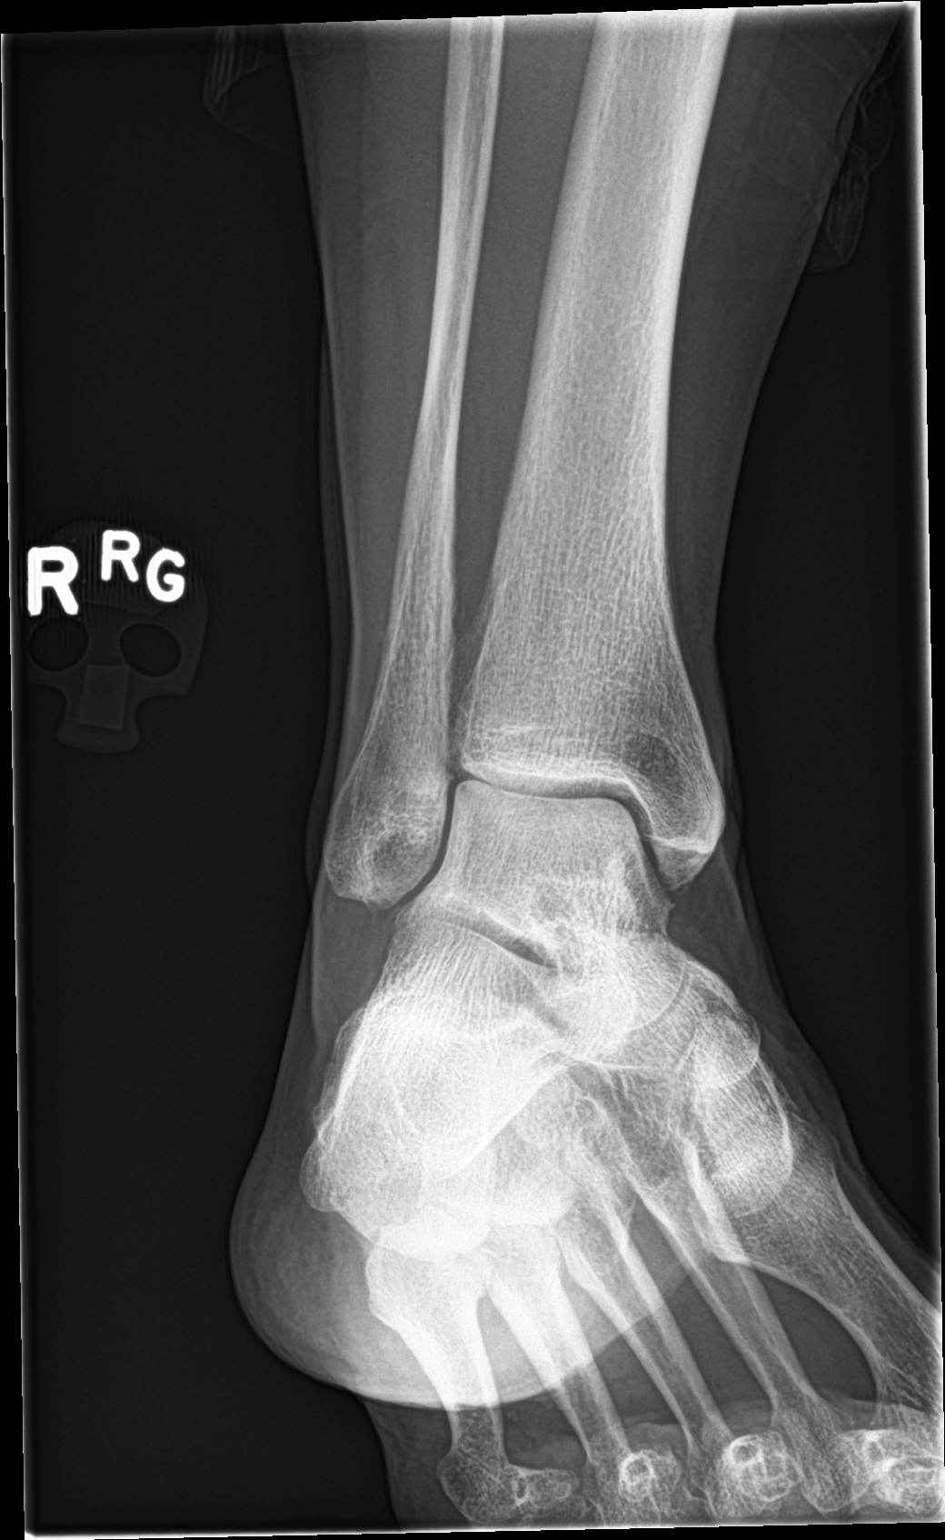

[ankle lat]
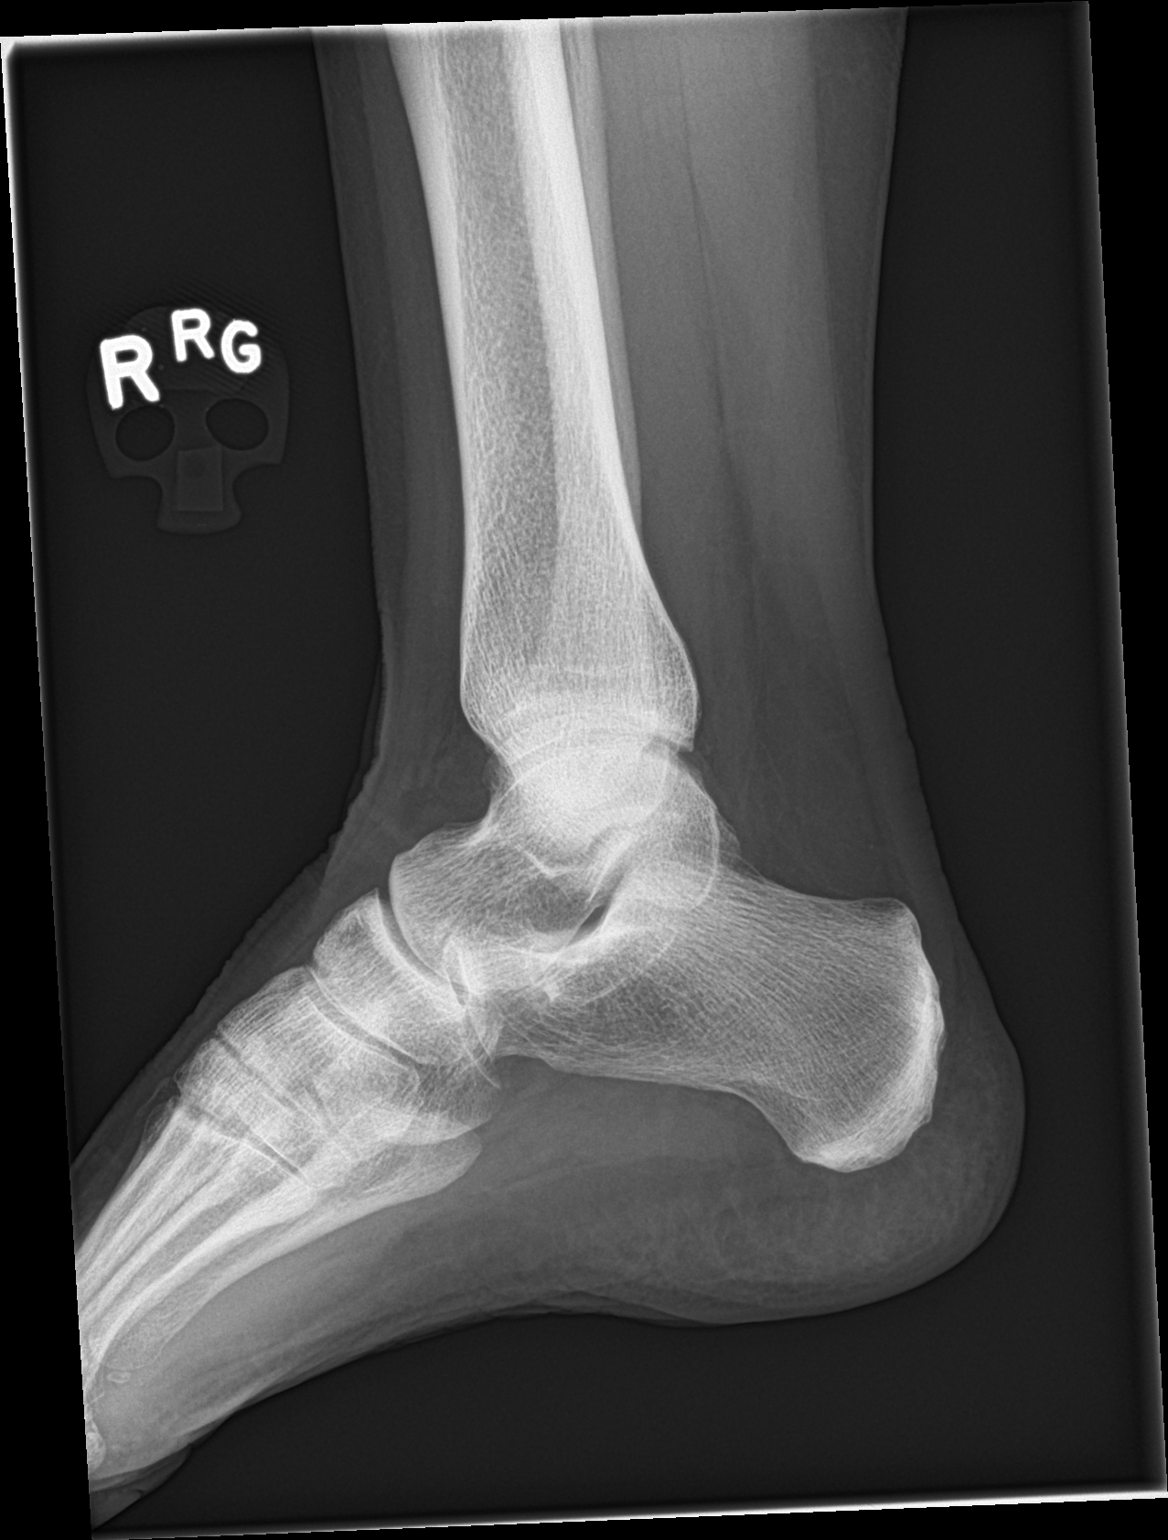

[3 of 3 positions shown; findings below may reference images not displayed]

FINDINGS: There is no evidence of fracture, dislocation, or joint effusion.
There is no evidence of arthropathy or other focal bone abnormality.
Soft tissues are unremarkable.
IMPRESSION: Negative.

## 2019-12-01 IMAGING — DX DG ANKLE COMPLETE 3+V*L*
3 series · 3 of 3 positions shown · non-contrast
Comparison: Intraoperative films, same date.

CLINICAL DATA: Left ankle fracture fixation.

EXAM:
LEFT ANKLE COMPLETE - 3+ VIEW

[ankle ap]
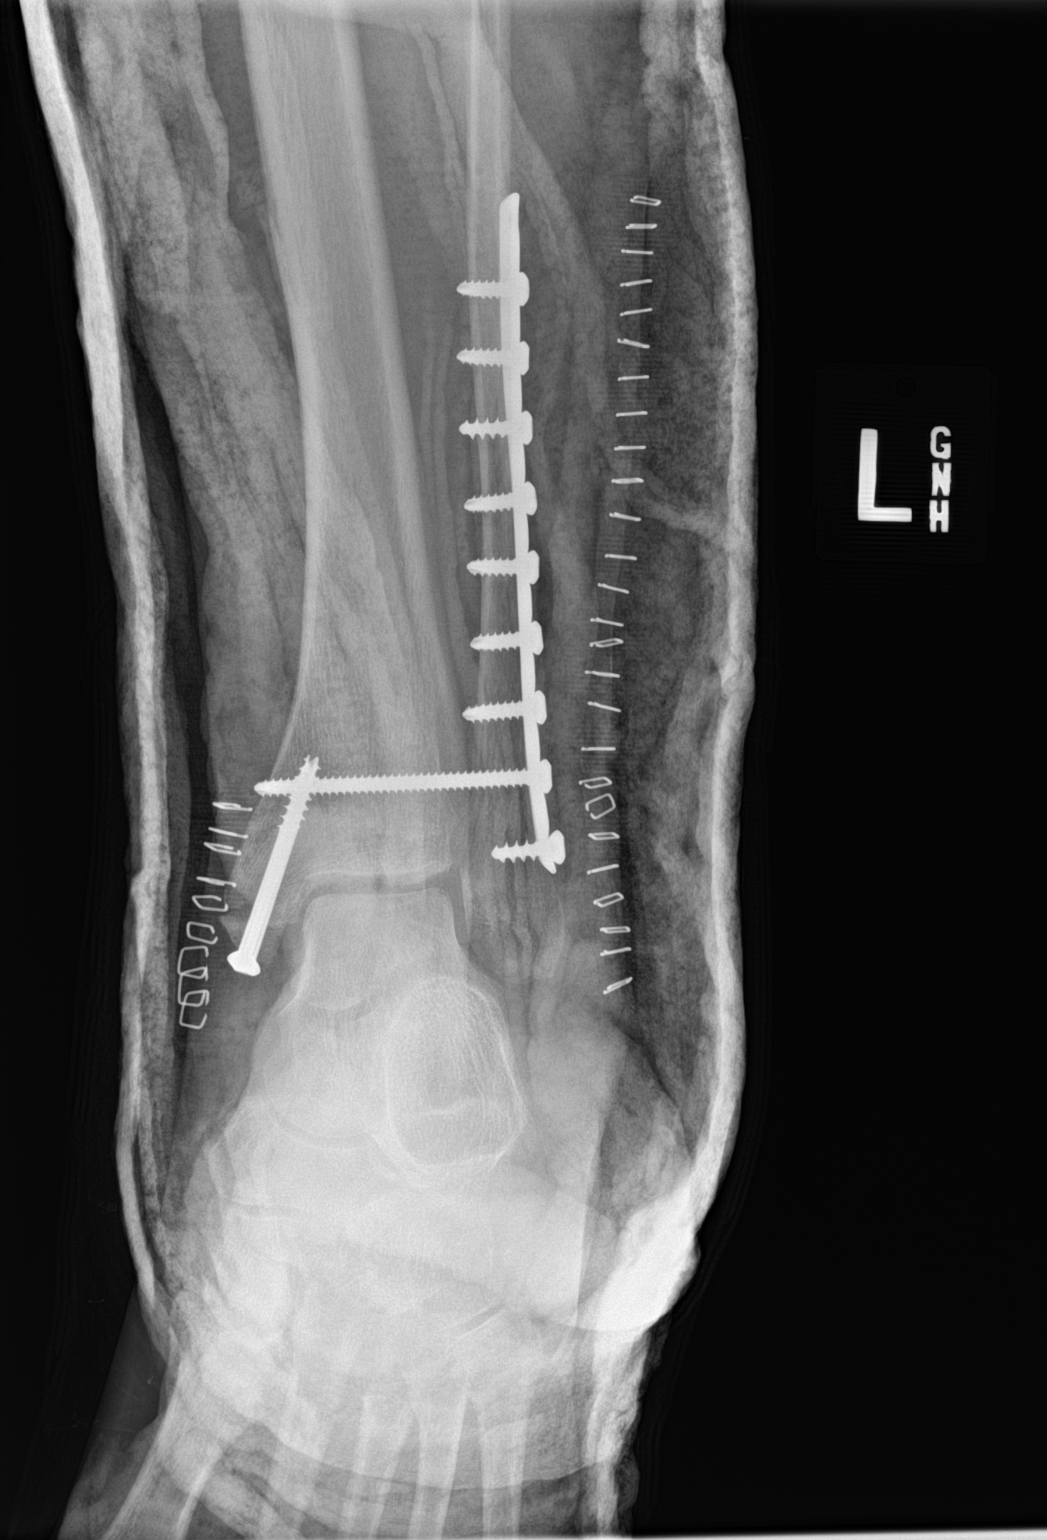

[ankle obl]
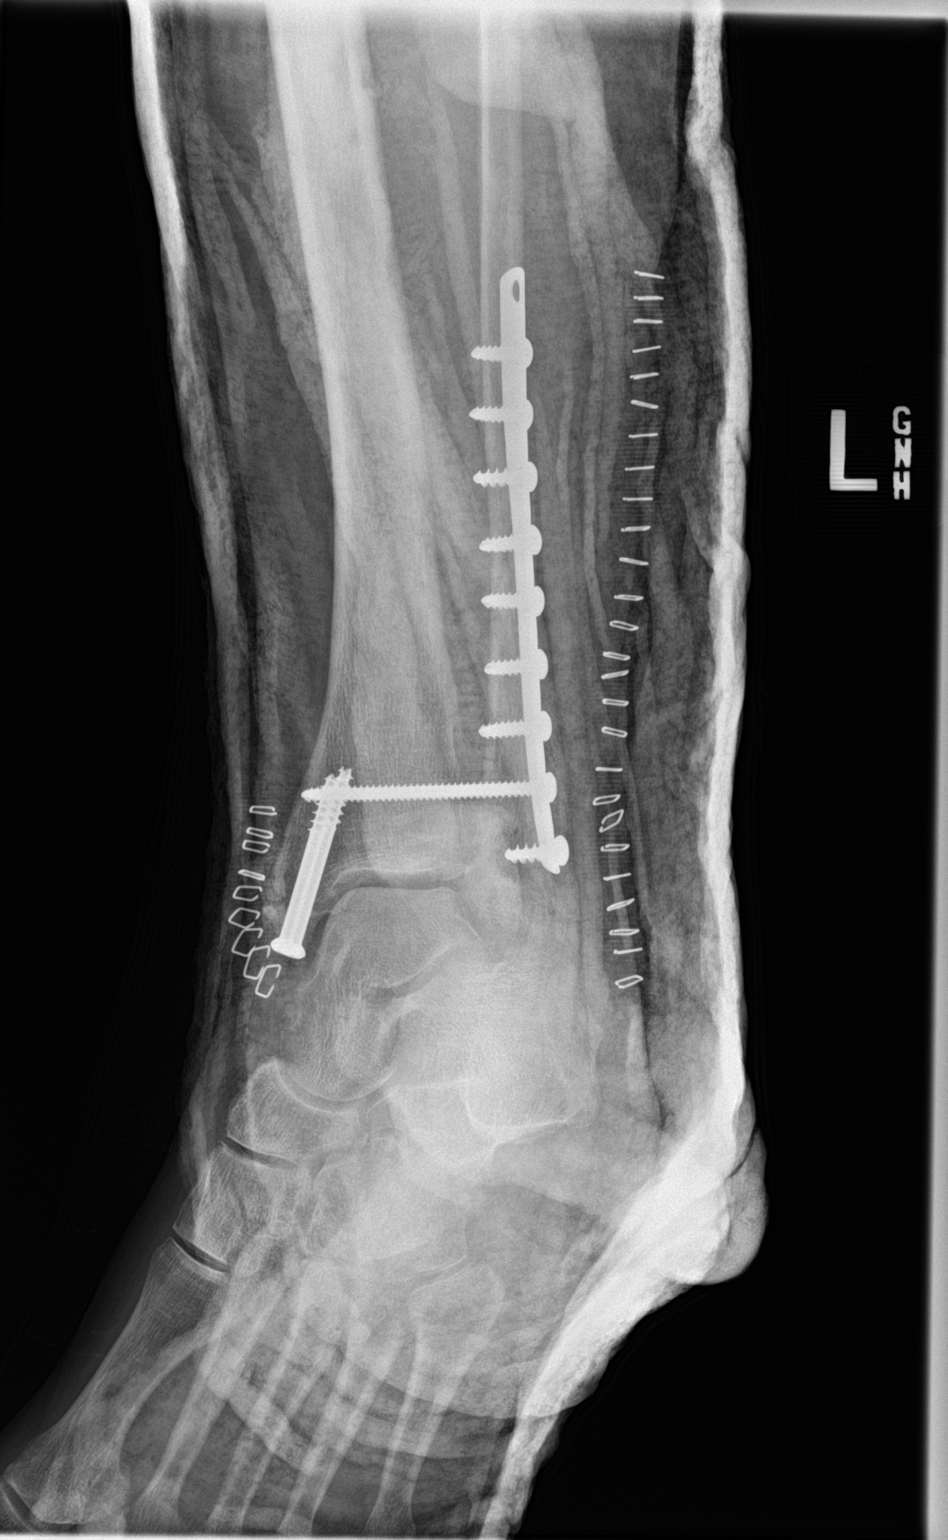

[ankle lat]
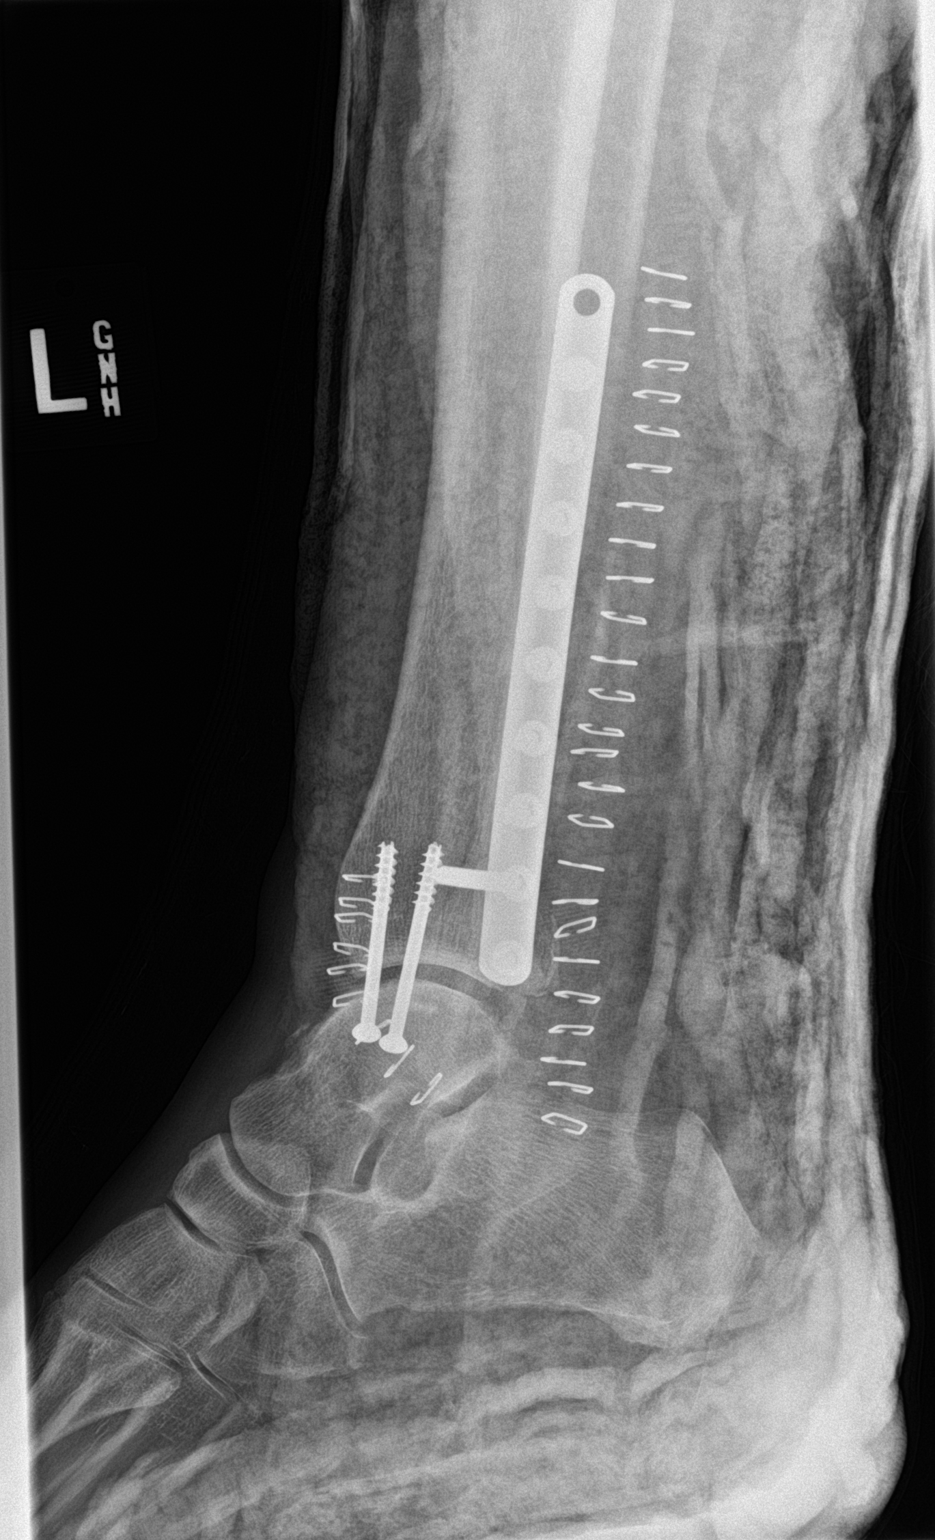

[3 of 3 positions shown; findings below may reference images not displayed]

FINDINGS: Postoperative changes and a plaster splint in place. Long lateral
sideplate and screws transfixing the fibular fracture with anatomic
reduction.

Two cannulated malleolar screws are transfixing the medial malleolus
fracture.

Small posterior malleolar fracture is again noted.
IMPRESSION: Anatomic reduction of the tibia and fibular fractures.
